# Patient Record
Sex: Male | Born: 1955 | Race: White | Hispanic: No | Marital: Married | Smoking: Former smoker
Health system: Southern US, Community
[De-identification: ages and names within clinical notes are randomized; demographics above are authoritative.]

## PROBLEM LIST (undated history)

## (undated) DIAGNOSIS — M1990 Unspecified osteoarthritis, unspecified site: Secondary | ICD-10-CM

## (undated) DIAGNOSIS — R51 Headache: Secondary | ICD-10-CM

## (undated) DIAGNOSIS — R42 Dizziness and giddiness: Secondary | ICD-10-CM

## (undated) DIAGNOSIS — J189 Pneumonia, unspecified organism: Secondary | ICD-10-CM

## (undated) DIAGNOSIS — R519 Headache, unspecified: Secondary | ICD-10-CM

## (undated) HISTORY — PX: TONSILLECTOMY: SUR1361

## (undated) HISTORY — PX: COLONOSCOPY: SHX174

---

## 2012-01-09 ENCOUNTER — Encounter: Payer: Self-pay | Admitting: *Deleted

## 2012-01-09 ENCOUNTER — Emergency Department
Admission: EM | Admit: 2012-01-09 | Discharge: 2012-01-09 | Disposition: A | Discharge status: Home / Self Care | Payer: Self-pay | Source: Home / Self Care | Attending: Emergency Medicine | Admitting: Emergency Medicine

## 2012-01-09 DIAGNOSIS — R05 Cough: Secondary | ICD-10-CM

## 2012-01-09 DIAGNOSIS — J069 Acute upper respiratory infection, unspecified: Secondary | ICD-10-CM

## 2012-01-09 DIAGNOSIS — R059 Cough, unspecified: Secondary | ICD-10-CM

## 2012-01-09 DIAGNOSIS — J209 Acute bronchitis, unspecified: Secondary | ICD-10-CM

## 2012-01-09 MED ORDER — AZITHROMYCIN 250 MG PO TABS: ORAL_TABLET | ORAL | Status: AC

## 2012-01-09 MED ORDER — BENZONATATE 200 MG PO CAPS: 200.0000 mg | ORAL_CAPSULE | Freq: Three times a day (TID) | ORAL | Status: AC | PRN

## 2012-01-09 MED ORDER — PREDNISONE (PAK) 10 MG PO TABS: 10.0000 mg | ORAL_TABLET | Freq: Every day | ORAL | Status: AC

## 2012-01-09 NOTE — ED Notes (Signed)
Patient c/o cough, congestion, sweats, fatigue and sinus drainage intermittently x 5 weeks. Taken day/nyquil, mucinex, and vitamin C.

## 2012-01-09 NOTE — ED Provider Notes (Signed)
History     CSN: [id number]  Arrival date & time 01/09/12  1337   First MD Initiated Contact with Patient 01/09/12 1346      Chief Complaint  Patient presents with  . Cough    (Consider location/radiation/quality/duration/timing/severity/associated sxs/prior treatment) HPI Allen Henry is a 56 y.o. male who complains of onset of cold symptoms for a month.  The symptoms are constant and mild-moderate in severity.  He states that the symptoms are intermittent and he gets better but then gets worse again.  As of today he is feeling a little bit better.  He is taking Mucinex, DayQuil, NyQuil, and vitamin C.  He states that he's been coughing a lot and that is causing him to be tired.  He feels been having a lot of sinus congestion and drainage. No sore throat + cough No pleuritic pain + wheezing + nasal congestion + post-nasal drainage + sinus pain/pressure + chest congestion No itchy/red eyes No earache No hemoptysis No SOB + chills/sweats No fever No nausea No vomiting No abdominal pain No diarrhea No skin rashes + fatigue No myalgias No headache    History reviewed. No pertinent past medical history.  Past Surgical History  Procedure Date  . Tonsillectomy     Family History  Problem Relation Age of Onset  . Heart attack Father     History  Substance Use Topics  . Smoking status: Former Games developer  . Smokeless tobacco: Not on file  . Alcohol Use: No      Review of Systems  All other systems reviewed and are negative.    Allergies  Penicillins  Home Medications   Current Outpatient Rx  Name Route Sig Dispense Refill  . AZITHROMYCIN 250 MG PO TABS  Use as directed 1 each 0  . BENZONATATE 200 MG PO CAPS Oral Take 1 capsule (200 mg total) by mouth 3 (three) times daily as needed for cough. 20 capsule 0  . PREDNISONE (PAK) 10 MG PO TABS Oral Take 1 tablet (10 mg total) by mouth daily. 6 day pack, use as directed, Disp 1 pack 21 tablet 0    BP 146/92   Pulse 106  Temp(Src) 98.1 F (36.7 C) (Oral)  Resp 18  Ht 5\' 10"  (1.778 m)  Wt 301 lb (136.533 kg)  BMI 43.19 kg/m2  SpO2 95%  Physical Exam  Nursing note and vitals reviewed. Constitutional: He is oriented to person, place, and time. He appears well-developed and well-nourished.  Non-toxic appearance. No distress.  HENT:  Head: Normocephalic and atraumatic.  Right Ear: Tympanic membrane, external ear and ear canal normal.  Left Ear: Tympanic membrane, external ear and ear canal normal.  Nose: Mucosal edema and rhinorrhea present.  Mouth/Throat: Abnormal dentition. Posterior oropharyngeal erythema present. No oropharyngeal exudate or posterior oropharyngeal edema.  Eyes: No scleral icterus.  Neck: Neck supple.  Cardiovascular: Regular rhythm and normal heart sounds.   Pulmonary/Chest: Effort normal and breath sounds normal. No respiratory distress. He has no decreased breath sounds. He has no wheezes. He has no rhonchi.  Neurological: He is alert and oriented to person, place, and time.  Skin: Skin is warm and dry.  Psychiatric: He has a normal mood and affect. His speech is normal.    ED Course  Procedures (including critical care time)  Labs Reviewed - No data to display No results found.   1. Acute bronchitis   2. Cough   3. Acute upper respiratory infections of unspecified site  MDM  1)  Take the prescribed antibiotic as instructed.  I feel that he most likely has bronchitis, possibly from an allergic rhinitis.  However we are gonna start him today on a Z-Pak to cover for any current infection, Tessalon for the cough and we gave him some samples, and prednisone to help with his wheezing and shortness of breath.  I let him know that we will hold off on the x-ray for today since he feels a bit better and I don't hear any evidence of pneumonia and because we are treating him anyway with antibiotics, however if within the next couple days he started feeling worse again  despite the treatment then I would suggest getting a chest x-ray.  He would agree to this especially since he does not have insurance and would like to keep his bill low. 2)  Use nasal saline solution (over the counter) at least 3 times a day. 3)  Use over the counter decongestants like Zyrtec-D every 12 hours as needed to help with congestion.  If you have hypertension, do not take medicines with sudafed.  4)  Can take tylenol every 6 hours or motrin every 8 hours for pain or fever. 5)  Follow up with your primary doctor if no improvement in 5-7 days, sooner if increasing pain, fever, or new symptoms.        Marlaine Hind, MD 01/09/12 1415

## 2014-04-14 ENCOUNTER — Encounter: Payer: Self-pay | Admitting: Emergency Medicine

## 2014-04-14 ENCOUNTER — Emergency Department
Admission: EM | Admit: 2014-04-14 | Discharge: 2014-04-14 | Disposition: A | Discharge status: Home / Self Care | Payer: Self-pay | Source: Home / Self Care | Attending: Emergency Medicine | Admitting: Emergency Medicine

## 2014-04-14 DIAGNOSIS — J069 Acute upper respiratory infection, unspecified: Secondary | ICD-10-CM

## 2014-04-14 HISTORY — DX: Dizziness and giddiness: R42

## 2014-04-14 MED ORDER — AMOXICILLIN 875 MG PO TABS: 875.0000 mg | ORAL_TABLET | Freq: Two times a day (BID) | ORAL | Status: DC

## 2014-04-14 MED ORDER — PREDNISONE 10 MG PO TABS: 20.0000 mg | ORAL_TABLET | Freq: Two times a day (BID) | ORAL | Status: DC

## 2014-04-14 NOTE — ED Provider Notes (Signed)
CSN: [id number]     Arrival date & time 04/14/14  [id number] History   First MD Initiated Contact with Patient 04/14/14 [phone]     Chief Complaint  Patient presents with  . Cough  . Otalgia   (Consider location/radiation/quality/duration/timing/severity/associated sxs/prior Treatment) HPI Allen Henry is a 58 y.o. male who complains of onset of cold symptoms for 4 days.  The symptoms are constant and mild-moderate in severity.  Taking ASA which helps and OTC cough & cold meds which help short-term. No sore throat +/- cough No pleuritic pain No wheezing + nasal congestion + post-nasal drainage No sinus pain/pressure No chest congestion No itchy/red eyes ++ L>R earache No hemoptysis No SOB No chills/sweats No fever No nausea No vomiting No abdominal pain No diarrhea No skin rashes No fatigue No myalgias + headache     Past Medical History  Diagnosis Date  . Vertigo    Past Surgical History  Procedure Laterality Date  . Tonsillectomy     Family History  Problem Relation Age of Onset  . Heart attack Father   . Cancer Father   . Cancer Mother    History  Substance Use Topics  . Smoking status: Former Games developermoker  . Smokeless tobacco: Not on file  . Alcohol Use: No    Review of Systems  All other systems reviewed and are negative.   Allergies  Penicillins  Home Medications   Prior to Admission medications   Medication Sig Start Date End Date Taking? Authorizing Provider  amoxicillin (AMOXIL) 875 MG tablet Take 1 tablet (875 mg total) by mouth 2 (two) times daily. 04/14/14   Marlaine HindJeffrey H Broc Caspers, MD  predniSONE (DELTASONE) 10 MG tablet Take 2 tablets (20 mg total) by mouth 2 (two) times daily with a meal. 04/14/14   Marlaine HindJeffrey H Laiyah Exline, MD   BP 161/90  Pulse 102  Temp(Src) 98.1 F (36.7 C) (Oral)  Resp 18  Ht 5\' 11"  (1.803 m)  Wt 303 lb (137.44 kg)  BMI 42.28 kg/m2  SpO2 96% Physical Exam  Nursing note and vitals reviewed. Constitutional: He is oriented to person,  place, and time. He appears well-developed and well-nourished.  HENT:  Head: Normocephalic and atraumatic.  Right Ear: External ear and ear canal normal. Tympanic membrane is erythematous. A middle ear effusion is present.  Left Ear: External ear and ear canal normal. Tympanic membrane is erythematous and bulging. A middle ear effusion is present.  Nose: Mucosal edema and rhinorrhea present.  Mouth/Throat: No oropharyngeal exudate, posterior oropharyngeal edema or posterior oropharyngeal erythema.  Eyes: No scleral icterus.  Neck: Neck supple.  Cardiovascular: Regular rhythm and normal heart sounds.   Pulmonary/Chest: Effort normal and breath sounds normal. No respiratory distress. He has no decreased breath sounds. He has no wheezes. He has no rhonchi.  Neurological: He is alert and oriented to person, place, and time.  Skin: Skin is warm and dry.  Psychiatric: He has a normal mood and affect. His speech is normal.    ED Course  Procedures (including critical care time) Labs Review Labs Reviewed - No data to display  Imaging Review No results found.   MDM   1. Acute upper respiratory infections of unspecified site    1)  Take the prescribed antibiotic as instructed.  Amox was prescribed.  Patient has remote history of penicillin allergy as very small child but has taken penicillin and amoxicillin since then with no problems, so I do not believe that he has a true penicillin allergy.  With a double ear infection, I think this would be the best antibiotic for this but gave him warning that if he had any reaction to stop and call.  Also gave him a prescription for prednisone that he's not getting better over the next few days if he can take that as well for symptomatic management. 2)  Use nasal saline solution (over the counter) at least 3 times a day. 3)  Use over the counter decongestants like Zyrtec-D every 12 hours as needed to help with congestion.  If you have hypertension, do not  take medicines with sudafed.  4)  Can take tylenol every 6 hours or motrin every 8 hours for pain or fever. 5)  Follow up with your primary doctor if no improvement in 5-7 days, sooner if increasing pain, fever, or new symptoms.     Marlaine HindJeffrey H Inaara Tye, MD 04/14/14 [phone]

## 2014-04-14 NOTE — ED Notes (Signed)
Pt c/o cough, chest congestion and LT ear pain x 4 days. Denies fever.

## 2014-04-17 ENCOUNTER — Telehealth: Payer: Self-pay | Admitting: Emergency Medicine

## 2014-04-22 ENCOUNTER — Telehealth: Payer: Self-pay | Admitting: Emergency Medicine

## 2017-01-23 ENCOUNTER — Ambulatory Visit (INDEPENDENT_AMBULATORY_CARE_PROVIDER_SITE_OTHER): Payer: Self-pay | Admitting: Specialist

## 2017-01-30 ENCOUNTER — Ambulatory Visit (INDEPENDENT_AMBULATORY_CARE_PROVIDER_SITE_OTHER): Payer: BLUE CROSS/BLUE SHIELD | Admitting: Specialist

## 2017-01-30 ENCOUNTER — Encounter (INDEPENDENT_AMBULATORY_CARE_PROVIDER_SITE_OTHER): Payer: Self-pay | Admitting: Specialist

## 2017-01-30 ENCOUNTER — Ambulatory Visit (INDEPENDENT_AMBULATORY_CARE_PROVIDER_SITE_OTHER): Payer: BLUE CROSS/BLUE SHIELD

## 2017-01-30 VITALS — BP 128/81 | HR 82 | Ht 71.0 in | Wt 315.0 lb

## 2017-01-30 DIAGNOSIS — M48062 Spinal stenosis, lumbar region with neurogenic claudication: Secondary | ICD-10-CM | POA: Diagnosis not present

## 2017-01-30 DIAGNOSIS — M47816 Spondylosis without myelopathy or radiculopathy, lumbar region: Secondary | ICD-10-CM

## 2017-01-30 DIAGNOSIS — M545 Low back pain: Secondary | ICD-10-CM

## 2017-01-30 MED ORDER — DICLOFENAC SODIUM 75 MG PO TBEC: 75.0000 mg | DELAYED_RELEASE_TABLET | Freq: Two times a day (BID) | ORAL | 4 refills | Status: DC

## 2017-01-30 NOTE — Patient Instructions (Signed)
Avoid bending, stooping and avoid lifting weights greater than 10 lbs. Avoid prolong standing and walking. Avoid frequent bending and stooping  No lifting greater than 10 lbs. May use ice or moist heat for pain. Weight loss is of benefit. Handicap license is approved.  

## 2017-01-30 NOTE — Progress Notes (Addendum)
Office Visit Note   Patient: Allen Henry           Date of Birth: 1956/03/13           MRN: [id number] Visit Date: 01/30/2017              Requested by: No referring provider defined for this encounter. PCP: No PCP Per Patient   Assessment & Plan: Visit Diagnoses:  1. Low back pain, unspecified back pain laterality, unspecified chronicity, with sciatica presence unspecified   2. Spondylosis without myelopathy or radiculopathy, lumbar region   3. 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 Noticed increasing discomfort into his back and buttocks and right posterior thigh and calf greater than left. Seen by  Dr Lorenso Courier in  [place] and underwent 2 facet injections. His studies,  MRI 04/2015 shows facet arthrosis nearly every segment of the lumbar spine with mild spurs anterior lumbar discs and moderate subarticular lateral recess stenosis L4-5 and moderate foramenal stenosis right L5-S1. Likely there are stenosis symptoms and spondylosis of the lumbar spine. I think he functions at a high level and can walk a mile though with some difficulty. He is borderline for considering any surgery. Recommend activity modification, primary flexion exercises which can be added to exercise therapy he is already doing in [facility]. Continue NSAIDs And consider gabapentin, weight loss, explained the rationale for conservative management and the pathophysiology of stenosis of the lumbar spine. Return in 6-8 weeks to assess his response to PT.   Plan: Avoid bending, stooping and avoid lifting weights greater than 10 lbs. Avoid prolong standing and walking. Avoid frequent bending and stooping  No lifting greater than 10 lbs. May use ice or moist heat for pain. Weight loss is of benefit. Handicap license is  approved.    Follow-Up Instructions: No Follow-up on file.   Orders:  Orders Placed This Encounter  Procedures  . XR Lumbar Spine 2-3 Views   Meds ordered this encounter  Medications  . diclofenac (VOLTAREN) 75 MG EC tablet    Sig: Take 1 tablet (75 mg total) by mouth 2 (two) times daily.    Dispense:  180 tablet    Refill:  4      Procedures: No procedures performed   Clinical Data: Findings:  Admit Date:04/27/2015 10:51  ###FINAL RESULT###    INDICATIONS: LOW BACK PAIN RADIATING TO RIGHT SIDE WITH POSTERIOR RIGHT  LEG PAIN TO FOOT, PAI COMMENTS:   PROCEDURE:QMR 1130- MRI L-SPINE WO CONT - Apr 27 2015  [employer] Accession #: [id number] [facility] Accession #: [id number]     MRI lumbar spine:  INDICATION: Low back pain radiating to the right leg and foot.  TECHNIQUE: Sagittal and axial T1 and T2-weighted sequences were  performed. Additional sagittal STIR images were performed.  COMPARISON: None available  FINDINGS: #Vertebral bodies: No compression fracture. #Alignment: Normal. #Marrow signal: No significant abnormality. #Conus medullaris: Normal. Terminates at L1 with no evidence of  tethering. #Lower thoracic segments: No significant abnormality. #   #L1-2: Moderate facet joint arthritis. #L2-3: Moderate facet joint arthritis. #L3-4: Moderate facet joint arthritis. #L4-5: Moderately severe facet joint arthritis with some thickening of  the ligamentum flavum. Moderate spinal stenosis. Moderate bilateral  foraminal stenosis. #L5-S1: Severe facet joint arthritis, worse on the right. Moderate  foraminal stenosis on the right.  IMPRESSION: Severe facet joint arthritis lower lumbar spine resulting in moderate  spinal stenosis at L4-5 and moderate foraminal stenoses bilaterally at  L4-5 and on the right at L5-S1.     Subjective: Chief Complaint  Patient presents with  . Lower Back - Pain, Injury    Feel  out of tub backward and hit toilet with back     61 year old male with history of low back pain greater than the neck and shoulder blades and into the legs. Neck with full ROM, aching occasionally but the neck feels okay. Has undergone facet injections in August 2016, and sept 27, 2016 with steriod and marcaine. Dr. Lorenso Courier, [facility], fax [phone]. Records [phone]. He missed follow up in December 2016. No rehab set up, told that he had bad  Arthritis but not disabiltiy. Pain is an 7-8 when it is worst, moving a wrong way. Bending hurts, stooping Reaching for shoes with pain. Walking is sore but able to walk, at work now. Sometimes has to sit to improve his standing tolerance. Occasionally with hand numbness right greater than left. He is right hand dominant. Fell on ice hitting his elbow and has noticed some numbness in the right hand since then. In to the right little finger side. He has a RCT tear right shoulder being treated with therapy. Goes to [facility] in [place]. Sleeping most okay but when his back is out it is difficult. Left leg flexion helps to decrease pain. When pain is worst hard to reach feet or perform hygiene post toilet, turning increases pain. Pain is episodic occasionally one a day sometimes couple of days to a week without pain. The right leg with numbness in the heel and into the toes all right foot. Working last one month, more active, noted less frequent episodes. 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     Review of Systems  Constitutional: Negative.   HENT: Negative.   Eyes: Negative.   Respiratory: Negative.   Cardiovascular: Negative.   Gastrointestinal: Negative.   Endocrine: Negative.   Genitourinary: Negative.   Musculoskeletal: Negative.   Skin: Negative.   Allergic/Immunologic: Negative.   Neurological: Negative.   Hematological: Negative.    Psychiatric/Behavioral: Negative.      Objective: Vital Signs: BP 128/81 (BP Location: Left Arm, Patient Position: Sitting)   Pulse 82   Ht  (1.803 m)   Wt (!) 315 lb (142.9 kg)   BMI 43.93 kg/m   Physical Exam  Constitutional: He is oriented to person, place, and time. He appears well-developed and well-nourished.  HENT:  Head: Normocephalic and atraumatic.  Eyes: EOM are normal. Pupils are equal, round, and reactive to light.  Neck: Normal range of motion. Neck supple.  Pulmonary/Chest: Effort normal and breath sounds normal.  Abdominal: Soft. Bowel sounds are normal.  Neurological: He is alert and oriented to person, place, and time.  Skin: Skin is warm and dry.  Psychiatric: He has a normal mood and affect. His behavior is normal. Judgment and thought content normal.    Back Exam   Tenderness  The patient is experiencing tenderness in the lumbar.  Range of Motion  Extension: abnormal  Flexion:  80 normal  Lateral Bend Right: normal  Lateral Bend Left: normal  Rotation Right: normal  Rotation Left: normal   Muscle Strength  Right Quadriceps:  5/5  Left Quadriceps:  5/5  Right Hamstrings:  5/5  Left Hamstrings:  5/5   Tests  Straight leg raise right: negative Straight leg raise left: negative  Reflexes  Patellar: normal Achilles: normal Babinski's sign: normal   Other  Toe Walk: normal Heel Walk: normal Sensation: normal Gait: normal  Erythema: no back redness Scars: absent      Specialty Comments:  No specialty comments available.  Imaging: No results found.   PMFS History: There are no active problems to display for this patient.  Past Medical History:  Diagnosis Date  . Vertigo     Family History  Problem Relation Age of Onset  . Heart attack Father   . Cancer Father   . Cancer Mother     Past Surgical History:  Procedure Laterality Date  . TONSILLECTOMY     Social History   Occupational History  . Not on file.    Social History Main Topics  . Smoking status: Former Games developer  . Smokeless tobacco: Never Used  . Alcohol use No  . Drug use: No  . Sexual activity: Not on file

## 2017-02-01 ENCOUNTER — Telehealth (INDEPENDENT_AMBULATORY_CARE_PROVIDER_SITE_OTHER): Payer: Self-pay | Admitting: Specialist

## 2017-02-01 NOTE — Telephone Encounter (Signed)
Pt wife asked if the Voltaren Tablet can be sent to [facility] on [address] In [place]. Pt didn't get a script.  [phone]

## 2017-02-01 NOTE — Telephone Encounter (Signed)
I called and advised pts wife Rinaldo Cloud that rx was called in on Wednesday to [facility] @ [employer]

## 2017-02-21 ENCOUNTER — Ambulatory Visit (INDEPENDENT_AMBULATORY_CARE_PROVIDER_SITE_OTHER): Payer: Self-pay | Admitting: Specialist

## 2017-03-06 ENCOUNTER — Ambulatory Visit (INDEPENDENT_AMBULATORY_CARE_PROVIDER_SITE_OTHER): Payer: Self-pay | Admitting: Specialist

## 2017-03-20 ENCOUNTER — Encounter (INDEPENDENT_AMBULATORY_CARE_PROVIDER_SITE_OTHER): Payer: Self-pay | Admitting: Specialist

## 2017-03-20 ENCOUNTER — Ambulatory Visit (INDEPENDENT_AMBULATORY_CARE_PROVIDER_SITE_OTHER): Payer: BLUE CROSS/BLUE SHIELD | Admitting: Specialist

## 2017-03-20 VITALS — BP 124/76 | HR 72 | Ht 71.0 in | Wt 300.0 lb

## 2017-03-20 DIAGNOSIS — M5416 Radiculopathy, lumbar region: Secondary | ICD-10-CM

## 2017-05-06 ENCOUNTER — Ambulatory Visit (INDEPENDENT_AMBULATORY_CARE_PROVIDER_SITE_OTHER): Payer: BLUE CROSS/BLUE SHIELD | Admitting: Specialist

## 2017-05-06 ENCOUNTER — Encounter (INDEPENDENT_AMBULATORY_CARE_PROVIDER_SITE_OTHER): Payer: Self-pay | Admitting: Specialist

## 2017-05-06 VITALS — BP 136/77 | HR 71 | Ht 70.5 in | Wt 291.0 lb

## 2017-05-06 DIAGNOSIS — M4726 Other spondylosis with radiculopathy, lumbar region: Secondary | ICD-10-CM | POA: Diagnosis not present

## 2017-05-06 DIAGNOSIS — M48062 Spinal stenosis, lumbar region with neurogenic claudication: Secondary | ICD-10-CM

## 2017-05-06 MED ORDER — OXAPROZIN 600 MG PO TABS: 600.0000 mg | ORAL_TABLET | Freq: Every day | ORAL | 3 refills | Status: DC

## 2017-05-06 MED ORDER — GABAPENTIN 300 MG PO CAPS: 300.0000 mg | ORAL_CAPSULE | Freq: Every day | ORAL | 3 refills | Status: DC

## 2017-05-06 NOTE — Progress Notes (Signed)
Office Visit Note   Patient: Allen NatalRaymond Henry           Date of Birth: 1955-12-25           MRN: [id number] Visit Date: 05/06/2017              Requested by: No referring provider defined for this encounter. PCP: Marvis RepressSchaeffer, Stanley, MD   Assessment & Plan: Visit Diagnoses:  1. Spinal stenosis of lumbar region with neurogenic claudication   2. Other spondylosis with radiculopathy, lumbar region     Plan: Avoid bending, stooping and avoid lifting weights greater than 10 lbs. Avoid prolong standing and walking. Avoid frequent bending and stooping  No lifting greater than 10 lbs. May use ice or moist heat for pain. Weight loss is of benefit. Handicap license is approved.   Follow-Up Instructions: No Follow-up on file.   Orders:  No orders of the defined types were placed in this encounter.  No orders of the defined types were placed in this encounter.     Procedures: No procedures performed   Clinical Data: No additional findings.   Subjective: Chief Complaint  Patient presents with  . 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 Pain is about 2/3rd s of the way down the back. Pain worsens sometimes with standing and walking. Bowel and bladder is okay. He notices pain with sitting in church with a twist, or turning when in bed. Yesterday he Had difficulty even walking a short distance. The parking lot has been redone and now the trucks are parked farther away and their parking is a problem.      Review of Systems  Constitutional: Negative.   HENT: Positive for rhinorrhea. Negative for ear discharge, ear pain, facial swelling, hearing loss, sore throat and tinnitus.   Eyes: Negative.  Negative for photophobia, pain, discharge, redness, itching and visual disturbance.  Respiratory: Negative.  Negative  for choking, chest tightness, shortness of breath and wheezing.   Cardiovascular: Negative.  Negative for chest pain.  Gastrointestinal: Negative.  Negative for abdominal distention, abdominal pain, anal bleeding, blood in stool, constipation, nausea and rectal pain.  Endocrine: Negative.   Genitourinary: Negative.  Negative for dysuria, enuresis and flank pain.  Musculoskeletal: Positive for back pain and gait problem. Negative for neck pain and neck stiffness.  Skin: Negative.  Negative for color change, pallor and rash.  Allergic/Immunologic: Negative.   Neurological: Positive for dizziness and syncope. Negative for weakness and numbness.  Hematological: Negative.  Negative for adenopathy. Does not bruise/bleed easily.  Psychiatric/Behavioral: Negative.  Negative for agitation, behavioral problems, confusion, decreased concentration, dysphoric mood, hallucinations, self-injury, sleep disturbance and suicidal ideas. The patient is not nervous/anxious and is not hyperactive.      Objective: Vital Signs: BP 136/77   Pulse 71   Ht 5' 10.5" (1.791 m)   Wt 291 lb (132 kg)   BMI 41.16 kg/m   Physical Exam  Constitutional: He is oriented to person, place, and time. He appears well-developed and well-nourished.  HENT:  Head: Normocephalic and atraumatic.  Eyes: Pupils are equal, round, and reactive to light. EOM are normal.  Neck: Normal range of motion. Neck supple.  Pulmonary/Chest: Effort normal and breath sounds normal.  Abdominal: Soft. Bowel sounds are normal.  Musculoskeletal: Normal range of motion.  Neurological: He is alert and oriented to person, place, and time.  Skin: Skin is warm and dry.  Psychiatric: He has a normal mood and affect. His behavior is normal. Judgment and thought content normal.    Ortho Exam  Specialty Comments:  No specialty comments available.  Imaging: No results found.   PMFS History: There are no active problems to display for this  patient.  Past Medical History:  Diagnosis Date  . Vertigo     Family History  Problem Relation Age of Onset  . Heart attack Father   . Cancer Father   . Cancer Mother     Past Surgical History:  Procedure Laterality Date  . TONSILLECTOMY     Social History   Occupational History  . Not on file.   Social History Main Topics  . Smoking status: Former Games developermoker  . Smokeless tobacco: Never Used  . Alcohol use No  . Drug use: No  . Sexual activity: Not on file

## 2017-05-06 NOTE — Patient Instructions (Signed)
Plan: Avoid bending, stooping and avoid lifting weights greater than 10 lbs. Avoid prolong standing and walking. Avoid frequent bending and stooping  No lifting greater than 10 lbs. May use ice or moist heat for pain. Weight loss is of benefit. Handicap license is approved. 

## 2017-06-06 ENCOUNTER — Other Ambulatory Visit (INDEPENDENT_AMBULATORY_CARE_PROVIDER_SITE_OTHER): Payer: Self-pay | Admitting: Specialist

## 2017-06-12 ENCOUNTER — Other Ambulatory Visit (HOSPITAL_COMMUNITY): Payer: Self-pay | Admitting: *Deleted

## 2017-06-12 ENCOUNTER — Encounter (HOSPITAL_COMMUNITY): Payer: Self-pay

## 2017-06-12 ENCOUNTER — Encounter (HOSPITAL_COMMUNITY)
Admission: RE | Admit: 2017-06-12 | Discharge: 2017-06-12 | Disposition: A | Discharge status: Home / Self Care | Payer: BLUE CROSS/BLUE SHIELD | Source: Ambulatory Visit | Attending: Specialist | Admitting: Specialist

## 2017-06-12 DIAGNOSIS — Z01812 Encounter for preprocedural laboratory examination: Secondary | ICD-10-CM | POA: Insufficient documentation

## 2017-06-12 DIAGNOSIS — R42 Dizziness and giddiness: Secondary | ICD-10-CM | POA: Diagnosis not present

## 2017-06-12 DIAGNOSIS — Z8701 Personal history of pneumonia (recurrent): Secondary | ICD-10-CM | POA: Diagnosis not present

## 2017-06-12 DIAGNOSIS — M1990 Unspecified osteoarthritis, unspecified site: Secondary | ICD-10-CM | POA: Diagnosis not present

## 2017-06-12 DIAGNOSIS — R51 Headache: Secondary | ICD-10-CM | POA: Diagnosis not present

## 2017-06-12 HISTORY — DX: Headache: R51

## 2017-06-12 HISTORY — DX: Headache, unspecified: R51.9

## 2017-06-12 HISTORY — DX: Unspecified osteoarthritis, unspecified site: M19.90

## 2017-06-12 HISTORY — DX: Pneumonia, unspecified organism: J18.9

## 2017-06-12 LAB — URINALYSIS, ROUTINE W REFLEX MICROSCOPIC
BILIRUBIN URINE: NEGATIVE
GLUCOSE, UA: NEGATIVE mg/dL
HGB URINE DIPSTICK: NEGATIVE
KETONES UR: NEGATIVE mg/dL
Leukocytes, UA: NEGATIVE
Nitrite: NEGATIVE
PH: 6 (ref 5.0–8.0)
Protein, ur: NEGATIVE mg/dL
SPECIFIC GRAVITY, URINE: 1.018 (ref 1.005–1.030)

## 2017-06-12 LAB — COMPREHENSIVE METABOLIC PANEL
ALK PHOS: 60 U/L (ref 38–126)
ALT: 18 U/L (ref 17–63)
AST: 24 U/L (ref 15–41)
Albumin: 3.7 g/dL (ref 3.5–5.0)
Anion gap: 9 (ref 5–15)
BILIRUBIN TOTAL: 1.5 mg/dL — AB (ref 0.3–1.2)
BUN: 11 mg/dL (ref 6–20)
CALCIUM: 9.1 mg/dL (ref 8.9–10.3)
CO2: 23 mmol/L (ref 22–32)
CREATININE: 0.93 mg/dL (ref 0.61–1.24)
Chloride: 108 mmol/L (ref 101–111)
GFR calc Af Amer: >60 mL/min (ref >60)
Glucose, Bld: 90 mg/dL (ref 65–99)
POTASSIUM: 4.4 mmol/L (ref 3.5–5.1)
Sodium: 140 mmol/L (ref 135–145)
TOTAL PROTEIN: 7 g/dL (ref 6.5–8.1)

## 2017-06-12 LAB — CBC
HEMATOCRIT: 49.1 % (ref 39.0–52.0)
Hemoglobin: 16.3 g/dL (ref 13.0–17.0)
MCH: 30.8 pg (ref 26.0–34.0)
MCHC: 33.2 g/dL (ref 30.0–36.0)
MCV: 92.8 fL (ref 78.0–100.0)
Platelets: 237 10*3/uL (ref 150–400)
RBC: 5.29 MIL/uL (ref 4.22–5.81)
RDW: 13.9 % (ref 11.5–15.5)
WBC: 9.7 10*3/uL (ref 4.0–10.5)

## 2017-06-12 LAB — PROTIME-INR
INR: 1.04
PROTHROMBIN TIME: 13.5 s (ref 11.4–15.2)

## 2017-06-12 LAB — APTT: aPTT: 43 seconds — ABNORMAL HIGH (ref 24–36)

## 2017-06-12 LAB — SURGICAL PCR SCREEN
MRSA, PCR: NEGATIVE
STAPHYLOCOCCUS AUREUS: NEGATIVE

## 2017-06-12 NOTE — Progress Notes (Signed)
Pt denies cardiac history, chest pain or sob. Pt states he is not diabetic. 

## 2017-06-12 NOTE — Pre-Procedure Instructions (Signed)
Allen BrasilRaymond Kenneth Odem Jr.  06/12/2017    Your procedure is scheduled on Friday, June 14, 2017 at 12:30 PM.   [facility] "A" Admitting Office at 10:30 AM.   Call this number if you have problems the morning of surgery: [phone]   Questions prior to day of surgery, please call [phone] between 8 & 4 PM.   Remember:  Do not eat food or drink liquids after midnight Thursday, 06/13/17.  Stop NSAIDS (Daypro, Aleve, Ibuprofen, etc) and  Multivitamins as of today. Do not use Aspirin products prior to surgery.    Do not wear jewelry.  Do not wear lotions, powders, cologne or deodorant.  Men may shave face and neck.  Do not bring valuables to the hospital.  [facility] is not responsible for any belongings or valuables.  Contacts, dentures or bridgework may not be worn into surgery.  Leave your suitcase in the car.  After surgery it may be brought to your room.  For patients admitted to the hospital, discharge time will be determined by your treatment team.  [facility] - Pre[facility]  Before surgery, you can play an important role.  Because skin is not sterile, your skin needs to be as free of germs as possible.  You can reduce the number of germs on you skin by washing with CHG (chlorahexidine gluconate) soap before surgery.  CHG is an antiseptic cleaner which kills germs and bonds with the skin to continue killing germs even after washing.  Please DO NOT use if you have an allergy to CHG or antibacterial soaps.  If your skin becomes reddened/irritated stop using the CHG and inform your nurse when you arrive at Short Stay.  Do not shave (including legs and underarms) for at least 48 hours prior to the first CHG shower.  You may shave your face.  Please follow these instructions carefully:   1.  Shower with CHG Soap the night before surgery and the                    morning of Surgery.  2.  If you choose to wash your hair, wash your hair  first as usual with your       normal shampoo.  3.  After you shampoo, rinse your hair and body thoroughly to remove the shampoo.  4.  Use CHG as you would any other liquid soap.  You can apply chg directly       to the skin and wash gently with scrungie or a clean washcloth.  5.  Apply the CHG Soap to your body ONLY FROM THE NECK DOWN.        Do not use on open wounds or open sores.  Avoid contact with your eyes, ears, mouth and genitals (private parts).  Wash genitals (private parts) with your normal soap.  6.  Wash thoroughly, paying special attention to the area where your surgery        will be performed.  7.  Thoroughly rinse your body with warm water from the neck down.  8.  DO NOT shower/wash with your normal soap after using and rinsing off       the CHG Soap.  9.  Pat yourself dry with a clean towel.            10.  Wear clean pajamas.            11.  Place clean sheets  on your bed the night of your first shower and do not        sleep with pets.  Day of Surgery  Do not apply any lotions/deodorants the morning of surgery.  Please wear clean clothes to the hospital.   Please read over the fact sheets that you were given.

## 2017-06-13 ENCOUNTER — Encounter (HOSPITAL_COMMUNITY): Payer: Self-pay | Admitting: Emergency Medicine

## 2017-06-13 ENCOUNTER — Encounter (HOSPITAL_COMMUNITY): Payer: Self-pay | Admitting: Certified Registered"

## 2017-06-13 MED ORDER — SODIUM CHLORIDE 0.9 % IV SOLN
1500.0000 mg | INTRAVENOUS | Status: DC
  Filled 2017-06-13: qty 1500

## 2017-06-14 ENCOUNTER — Ambulatory Visit (HOSPITAL_COMMUNITY): Admission: RE | Admit: 2017-06-14 | Payer: BLUE CROSS/BLUE SHIELD | Source: Ambulatory Visit | Admitting: Specialist

## 2017-06-14 ENCOUNTER — Encounter (HOSPITAL_COMMUNITY): Admission: RE | Payer: Self-pay | Source: Ambulatory Visit

## 2017-06-14 SURGERY — MICRODISCECTOMY LUMBAR LAMINECTOMY
Anesthesia: General

## 2017-06-19 ENCOUNTER — Ambulatory Visit (HOSPITAL_COMMUNITY): Payer: BLUE CROSS/BLUE SHIELD

## 2017-06-19 ENCOUNTER — Inpatient Hospital Stay (HOSPITAL_COMMUNITY)
Admission: AD | Admit: 2017-06-19 | Discharge: 2017-06-21 | DRG: 517 | Disposition: A | Discharge status: Home / Self Care | Payer: BLUE CROSS/BLUE SHIELD | Source: Ambulatory Visit | Attending: Specialist | Admitting: Specialist

## 2017-06-19 ENCOUNTER — Encounter (HOSPITAL_COMMUNITY): Payer: Self-pay

## 2017-06-19 ENCOUNTER — Ambulatory Visit (HOSPITAL_COMMUNITY): Payer: BLUE CROSS/BLUE SHIELD | Admitting: Certified Registered"

## 2017-06-19 ENCOUNTER — Inpatient Hospital Stay (HOSPITAL_COMMUNITY)
Admission: AD | Disposition: A | Discharge status: Home / Self Care | Payer: Self-pay | Source: Ambulatory Visit | Attending: Specialist

## 2017-06-19 DIAGNOSIS — M48061 Spinal stenosis, lumbar region without neurogenic claudication: Secondary | ICD-10-CM | POA: Diagnosis present

## 2017-06-19 DIAGNOSIS — Z87891 Personal history of nicotine dependence: Secondary | ICD-10-CM

## 2017-06-19 DIAGNOSIS — Z79899 Other long term (current) drug therapy: Secondary | ICD-10-CM

## 2017-06-19 DIAGNOSIS — Z419 Encounter for procedure for purposes other than remedying health state, unspecified: Secondary | ICD-10-CM

## 2017-06-19 DIAGNOSIS — Z9889 Other specified postprocedural states: Secondary | ICD-10-CM

## 2017-06-19 DIAGNOSIS — M4807 Spinal stenosis, lumbosacral region: Secondary | ICD-10-CM | POA: Diagnosis present

## 2017-06-19 DIAGNOSIS — M48062 Spinal stenosis, lumbar region with neurogenic claudication: Secondary | ICD-10-CM

## 2017-06-19 HISTORY — PX: LUMBAR LAMINECTOMY: SHX95

## 2017-06-19 SURGERY — MICRODISCECTOMY LUMBAR LAMINECTOMY
Anesthesia: General | Site: Spine Lumbar

## 2017-06-19 MED ORDER — GELATIN ABSORBABLE MT POWD
OROMUCOSAL | Status: DC | PRN
  Administered 2017-06-19: 5 mL via TOPICAL

## 2017-06-19 MED ORDER — PHENOL 1.4 % MT LIQD: 1.0000 | OROMUCOSAL | Status: DC | PRN

## 2017-06-19 MED ORDER — SUCCINYLCHOLINE CHLORIDE 200 MG/10ML IV SOSY
PREFILLED_SYRINGE | INTRAVENOUS | Status: AC
  Filled 2017-06-19: qty 10

## 2017-06-19 MED ORDER — PROPOFOL 10 MG/ML IV BOLUS
INTRAVENOUS | Status: DC | PRN
  Administered 2017-06-19: 200 mg via INTRAVENOUS

## 2017-06-19 MED ORDER — ALBUTEROL SULFATE HFA 108 (90 BASE) MCG/ACT IN AERS
INHALATION_SPRAY | RESPIRATORY_TRACT | Status: AC
  Filled 2017-06-19: qty 6.7

## 2017-06-19 MED ORDER — THROMBIN 20000 UNITS EX SOLR
CUTANEOUS | Status: AC
  Filled 2017-06-19: qty 20000

## 2017-06-19 MED ORDER — HYDROMORPHONE HCL 1 MG/ML IJ SOLN
INTRAMUSCULAR | Status: AC
  Administered 2017-06-19: 0.5 mg via INTRAVENOUS
  Filled 2017-06-19: qty 1

## 2017-06-19 MED ORDER — ADULT MULTIVITAMIN W/MINERALS CH
1.0000 | ORAL_TABLET | Freq: Every day | ORAL | Status: DC
  Administered 2017-06-20 – 2017-06-21 (×2): 1 via ORAL
  Filled 2017-06-19 (×2): qty 1

## 2017-06-19 MED ORDER — HYDROMORPHONE HCL 1 MG/ML IJ SOLN
0.2500 mg | INTRAMUSCULAR | Status: DC | PRN
  Administered 2017-06-19 (×2): 0.5 mg via INTRAVENOUS

## 2017-06-19 MED ORDER — ALBUMIN HUMAN 5 % IV SOLN
INTRAVENOUS | Status: DC | PRN
  Administered 2017-06-19: 17:00:00 via INTRAVENOUS

## 2017-06-19 MED ORDER — ONDANSETRON HCL 4 MG/2ML IJ SOLN: 4.0000 mg | Freq: Four times a day (QID) | INTRAMUSCULAR | Status: DC | PRN

## 2017-06-19 MED ORDER — LIDOCAINE HCL (CARDIAC) 20 MG/ML IV SOLN
INTRAVENOUS | Status: DC | PRN
  Administered 2017-06-19: 100 mg via INTRAVENOUS

## 2017-06-19 MED ORDER — OXYCODONE HCL 5 MG PO TABS
5.0000 mg | ORAL_TABLET | ORAL | Status: DC | PRN
  Administered 2017-06-20: 10 mg via ORAL
  Filled 2017-06-19: qty 2

## 2017-06-19 MED ORDER — SUGAMMADEX SODIUM 200 MG/2ML IV SOLN
INTRAVENOUS | Status: DC | PRN
Start: 2017-06-19 — End: 2017-06-19
  Administered 2017-06-19: 200 mg via INTRAVENOUS

## 2017-06-19 MED ORDER — BUPIVACAINE HCL 0.5 % IJ SOLN: INTRAMUSCULAR | Status: DC | PRN

## 2017-06-19 MED ORDER — MENTHOL 3 MG MT LOZG: 1.0000 | LOZENGE | OROMUCOSAL | Status: DC | PRN

## 2017-06-19 MED ORDER — FENTANYL CITRATE (PF) 250 MCG/5ML IJ SOLN
INTRAMUSCULAR | Status: AC
  Filled 2017-06-19: qty 5

## 2017-06-19 MED ORDER — ROCURONIUM BROMIDE 100 MG/10ML IV SOLN
INTRAVENOUS | Status: DC | PRN
  Administered 2017-06-19: 20 mg via INTRAVENOUS
  Administered 2017-06-19: 60 mg via INTRAVENOUS
  Administered 2017-06-19 (×2): 20 mg via INTRAVENOUS

## 2017-06-19 MED ORDER — OXYCODONE HCL ER 10 MG PO T12A
10.0000 mg | EXTENDED_RELEASE_TABLET | Freq: Two times a day (BID) | ORAL | Status: DC
  Administered 2017-06-19 – 2017-06-21 (×4): 10 mg via ORAL
  Filled 2017-06-19 (×4): qty 1

## 2017-06-19 MED ORDER — OXYCODONE HCL 5 MG/5ML PO SOLN
5.0000 mg | Freq: Once | ORAL | Status: DC | PRN
Start: 2017-06-19 — End: 2017-06-19

## 2017-06-19 MED ORDER — KETOROLAC TROMETHAMINE 15 MG/ML IJ SOLN
15.0000 mg | Freq: Four times a day (QID) | INTRAMUSCULAR | Status: AC
  Administered 2017-06-19 – 2017-06-20 (×4): 15 mg via INTRAVENOUS
  Filled 2017-06-19 (×4): qty 1

## 2017-06-19 MED ORDER — ACETAMINOPHEN 650 MG RE SUPP: 650.0000 mg | RECTAL | Status: DC | PRN

## 2017-06-19 MED ORDER — HYDROMORPHONE HCL 1 MG/ML IJ SOLN
INTRAMUSCULAR | Status: AC
  Filled 2017-06-19: qty 1

## 2017-06-19 MED ORDER — DEXAMETHASONE SODIUM PHOSPHATE 10 MG/ML IJ SOLN
INTRAMUSCULAR | Status: DC | PRN
  Administered 2017-06-19: 10 mg via INTRAVENOUS

## 2017-06-19 MED ORDER — PROPOFOL 10 MG/ML IV BOLUS
INTRAVENOUS | Status: AC
  Filled 2017-06-19: qty 20

## 2017-06-19 MED ORDER — VANCOMYCIN HCL 10 G IV SOLR
1500.0000 mg | Freq: Once | INTRAVENOUS | Status: AC
  Administered 2017-06-20: 1500 mg via INTRAVENOUS
  Filled 2017-06-19: qty 1500

## 2017-06-19 MED ORDER — BUPIVACAINE LIPOSOME 1.3 % IJ SUSP
INTRAMUSCULAR | Status: DC | PRN
Start: 2017-06-19 — End: 2017-06-19
  Administered 2017-06-19: 20 mL

## 2017-06-19 MED ORDER — PHENYLEPHRINE HCL 10 MG/ML IJ SOLN
INTRAVENOUS | Status: DC | PRN
  Administered 2017-06-19: 10 ug/min via INTRAVENOUS

## 2017-06-19 MED ORDER — EPHEDRINE 5 MG/ML INJ
INTRAVENOUS | Status: AC
  Filled 2017-06-19: qty 10

## 2017-06-19 MED ORDER — FLEET ENEMA 7-19 GM/118ML RE ENEM: 1.0000 | ENEMA | Freq: Once | RECTAL | Status: DC | PRN

## 2017-06-19 MED ORDER — CHLORHEXIDINE GLUCONATE 4 % EX LIQD: 60.0000 mL | Freq: Once | CUTANEOUS | Status: DC

## 2017-06-19 MED ORDER — ONDANSETRON HCL 4 MG/2ML IJ SOLN
INTRAMUSCULAR | Status: AC
  Filled 2017-06-19: qty 2

## 2017-06-19 MED ORDER — SODIUM CHLORIDE 0.9 % IV SOLN
INTRAVENOUS | Status: DC
  Administered 2017-06-19 – 2017-06-21 (×2): via INTRAVENOUS

## 2017-06-19 MED ORDER — ONDANSETRON HCL 4 MG PO TABS: 4.0000 mg | ORAL_TABLET | Freq: Four times a day (QID) | ORAL | Status: DC | PRN

## 2017-06-19 MED ORDER — ALUM & MAG HYDROXIDE-SIMETH 200-200-20 MG/5ML PO SUSP: 30.0000 mL | Freq: Four times a day (QID) | ORAL | Status: DC | PRN

## 2017-06-19 MED ORDER — BUPIVACAINE HCL (PF) 0.5 % IJ SOLN
INTRAMUSCULAR | Status: AC
  Filled 2017-06-19: qty 30

## 2017-06-19 MED ORDER — BUPIVACAINE LIPOSOME 1.3 % IJ SUSP
20.0000 mL | INTRAMUSCULAR | Status: DC
  Filled 2017-06-19: qty 20

## 2017-06-19 MED ORDER — THROMBIN 5000 UNITS EX SOLR
CUTANEOUS | Status: AC
  Filled 2017-06-19: qty 5000

## 2017-06-19 MED ORDER — MIDAZOLAM HCL 2 MG/2ML IJ SOLN
INTRAMUSCULAR | Status: AC
  Filled 2017-06-19: qty 2

## 2017-06-19 MED ORDER — MIDAZOLAM HCL 5 MG/5ML IJ SOLN
INTRAMUSCULAR | Status: DC | PRN
  Administered 2017-06-19: 2 mg via INTRAVENOUS

## 2017-06-19 MED ORDER — POLYETHYLENE GLYCOL 3350 17 G PO PACK: 17.0000 g | PACK | Freq: Every day | ORAL | Status: DC | PRN

## 2017-06-19 MED ORDER — 0.9 % SODIUM CHLORIDE (POUR BTL) OPTIME
TOPICAL | Status: DC | PRN
  Administered 2017-06-19: 1000 mL

## 2017-06-19 MED ORDER — BISACODYL 5 MG PO TBEC: 5.0000 mg | DELAYED_RELEASE_TABLET | Freq: Every day | ORAL | Status: DC | PRN

## 2017-06-19 MED ORDER — GABAPENTIN 300 MG PO CAPS
300.0000 mg | ORAL_CAPSULE | Freq: Every day | ORAL | Status: DC
  Administered 2017-06-19 – 2017-06-20 (×2): 300 mg via ORAL
  Filled 2017-06-19 (×2): qty 1

## 2017-06-19 MED ORDER — ACETAMINOPHEN 325 MG PO TABS
650.0000 mg | ORAL_TABLET | ORAL | Status: DC | PRN
  Administered 2017-06-21: 650 mg via ORAL
  Filled 2017-06-19: qty 2

## 2017-06-19 MED ORDER — FENTANYL CITRATE (PF) 100 MCG/2ML IJ SOLN
INTRAMUSCULAR | Status: DC | PRN
  Administered 2017-06-19 (×2): 50 ug via INTRAVENOUS
  Administered 2017-06-19: 100 ug via INTRAVENOUS

## 2017-06-19 MED ORDER — OXYCODONE HCL 5 MG PO TABS: 5.0000 mg | ORAL_TABLET | Freq: Once | ORAL | Status: DC | PRN

## 2017-06-19 MED ORDER — THROMBIN 20000 UNITS EX KIT
PACK | CUTANEOUS | Status: DC | PRN
  Administered 2017-06-19: 20 mL via TOPICAL

## 2017-06-19 MED ORDER — ROCURONIUM BROMIDE 10 MG/ML (PF) SYRINGE
PREFILLED_SYRINGE | INTRAVENOUS | Status: AC
  Filled 2017-06-19: qty 20

## 2017-06-19 MED ORDER — SODIUM CHLORIDE 0.9 % IV SOLN: 250.0000 mL | INTRAVENOUS | Status: DC

## 2017-06-19 MED ORDER — ALBUTEROL SULFATE HFA 108 (90 BASE) MCG/ACT IN AERS
INHALATION_SPRAY | RESPIRATORY_TRACT | Status: DC | PRN
  Administered 2017-06-19 (×3): 4 via RESPIRATORY_TRACT

## 2017-06-19 MED ORDER — DOCUSATE SODIUM 100 MG PO CAPS
100.0000 mg | ORAL_CAPSULE | Freq: Two times a day (BID) | ORAL | Status: DC
  Administered 2017-06-19 – 2017-06-21 (×4): 100 mg via ORAL
  Filled 2017-06-19 (×4): qty 1

## 2017-06-19 MED ORDER — PANTOPRAZOLE SODIUM 40 MG IV SOLR
40.0000 mg | Freq: Every day | INTRAVENOUS | Status: DC
  Administered 2017-06-19: 40 mg via INTRAVENOUS
  Filled 2017-06-19: qty 40

## 2017-06-19 MED ORDER — SUGAMMADEX SODIUM 200 MG/2ML IV SOLN
INTRAVENOUS | Status: AC
  Filled 2017-06-19: qty 2

## 2017-06-19 MED ORDER — METHOCARBAMOL 1000 MG/10ML IJ SOLN
500.0000 mg | Freq: Four times a day (QID) | INTRAVENOUS | Status: DC | PRN
  Filled 2017-06-19: qty 5

## 2017-06-19 MED ORDER — SODIUM CHLORIDE 0.9% FLUSH
3.0000 mL | Freq: Two times a day (BID) | INTRAVENOUS | Status: DC
  Administered 2017-06-21: 3 mL via INTRAVENOUS

## 2017-06-19 MED ORDER — VANCOMYCIN HCL IN DEXTROSE 1-5 GM/200ML-% IV SOLN
INTRAVENOUS | Status: AC
  Filled 2017-06-19: qty 200

## 2017-06-19 MED ORDER — VANCOMYCIN HCL 1000 MG IV SOLR
INTRAVENOUS | Status: DC | PRN
  Administered 2017-06-19: 1000 mg via INTRAVENOUS

## 2017-06-19 MED ORDER — SODIUM CHLORIDE 0.9% FLUSH
3.0000 mL | INTRAVENOUS | Status: DC | PRN
  Administered 2017-06-21: 3 mL via INTRAVENOUS
  Filled 2017-06-19: qty 3

## 2017-06-19 MED ORDER — EPHEDRINE SULFATE-NACL 50-0.9 MG/10ML-% IV SOSY
PREFILLED_SYRINGE | INTRAVENOUS | Status: DC | PRN
Start: 2017-06-19 — End: 2017-06-19
  Administered 2017-06-19 (×2): 5 mg via INTRAVENOUS

## 2017-06-19 MED ORDER — ONDANSETRON HCL 4 MG/2ML IJ SOLN
INTRAMUSCULAR | Status: DC | PRN
  Administered 2017-06-19: 4 mg via INTRAVENOUS

## 2017-06-19 MED ORDER — METHOCARBAMOL 500 MG PO TABS
500.0000 mg | ORAL_TABLET | Freq: Four times a day (QID) | ORAL | Status: DC | PRN
  Administered 2017-06-21: 500 mg via ORAL
  Filled 2017-06-19: qty 1

## 2017-06-19 MED ORDER — SILDENAFIL CITRATE 20 MG PO TABS
20.0000 mg | ORAL_TABLET | Freq: Three times a day (TID) | ORAL | Status: DC | PRN
  Filled 2017-06-19: qty 1

## 2017-06-19 MED ORDER — LACTATED RINGERS IV SOLN
INTRAVENOUS | Status: DC
  Administered 2017-06-19 (×2): via INTRAVENOUS

## 2017-06-19 SURGICAL SUPPLY — 54 items
BENZOIN TINCTURE PRP APPL 2/3 (GAUZE/BANDAGES/DRESSINGS) ×2 IMPLANT
BUR ROUND FLUTED 4 SOFT TCH (BURR) IMPLANT
BUR SABER RD CUTTING 3.0 (BURR) ×4 IMPLANT
CANISTER SUCT 3000ML PPV (MISCELLANEOUS) ×2 IMPLANT
COVER MAYO STAND STRL (DRAPES) ×2 IMPLANT
DERMABOND ADVANCED (GAUZE/BANDAGES/DRESSINGS) ×1
DERMABOND ADVANCED .7 DNX12 (GAUZE/BANDAGES/DRESSINGS) ×1 IMPLANT
DRAPE C-ARM 42X72 X-RAY (DRAPES) IMPLANT
DRAPE HALF SHEET 40X57 (DRAPES) ×2 IMPLANT
DRAPE MICROSCOPE LEICA (MISCELLANEOUS) ×2 IMPLANT
DRAPE SURG 17X23 STRL (DRAPES) ×8 IMPLANT
DRSG MEPILEX BORDER 4X8 (GAUZE/BANDAGES/DRESSINGS) ×2 IMPLANT
DURAPREP 26ML APPLICATOR (WOUND CARE) ×2 IMPLANT
ELECT BLADE 4.0 EZ CLEAN MEGAD (MISCELLANEOUS) ×2
ELECT CAUTERY BLADE 6.4 (BLADE) ×2 IMPLANT
ELECT REM PT RETURN 9FT ADLT (ELECTROSURGICAL) ×2
ELECTRODE BLDE 4.0 EZ CLN MEGD (MISCELLANEOUS) ×1 IMPLANT
ELECTRODE REM PT RTRN 9FT ADLT (ELECTROSURGICAL) ×1 IMPLANT
GLOVE BIO SURGEON STRL SZ 6 (GLOVE) ×2 IMPLANT
GLOVE BIOGEL PI IND STRL 8 (GLOVE) ×1 IMPLANT
GLOVE BIOGEL PI INDICATOR 8 (GLOVE) ×1
GLOVE ECLIPSE 8.5 STRL (GLOVE) ×4 IMPLANT
GLOVE ORTHO TXT STRL SZ7.5 (GLOVE) ×2 IMPLANT
GLOVE SURG 8.5 LATEX PF (GLOVE) ×4 IMPLANT
GOWN STRL REUS W/ TWL LRG LVL3 (GOWN DISPOSABLE) ×1 IMPLANT
GOWN STRL REUS W/TWL 2XL LVL3 (GOWN DISPOSABLE) ×4 IMPLANT
GOWN STRL REUS W/TWL LRG LVL3 (GOWN DISPOSABLE) ×1
HEMOSTAT POWDER KIT SURGIFOAM (HEMOSTASIS) ×2 IMPLANT
KIT BASIN OR (CUSTOM PROCEDURE TRAY) ×2 IMPLANT
KIT ROOM TURNOVER OR (KITS) ×2 IMPLANT
NEEDLE SPNL 18GX3.5 QUINCKE PK (NEEDLE) ×4 IMPLANT
NS IRRIG 1000ML POUR BTL (IV SOLUTION) ×2 IMPLANT
PACK LAMINECTOMY ORTHO (CUSTOM PROCEDURE TRAY) ×2 IMPLANT
PAD ARMBOARD 7.5X6 YLW CONV (MISCELLANEOUS) ×4 IMPLANT
PATTIES SURGICAL .5 X.5 (GAUZE/BANDAGES/DRESSINGS) IMPLANT
PATTIES SURGICAL .75X.75 (GAUZE/BANDAGES/DRESSINGS) IMPLANT
SPONGE INTESTINAL PEANUT (DISPOSABLE) ×2 IMPLANT
SPONGE LAP 4X18 X RAY DECT (DISPOSABLE) ×4 IMPLANT
SPONGE SURGIFOAM ABS GEL 100 (HEMOSTASIS) ×2 IMPLANT
STRIP CLOSURE SKIN 1/2X4 (GAUZE/BANDAGES/DRESSINGS) ×2 IMPLANT
SUT VIC AB 0 CT1 18XCR BRD8 (SUTURE) ×1 IMPLANT
SUT VIC AB 0 CT1 8-18 (SUTURE) ×1
SUT VIC AB 1 CTX 36 (SUTURE) ×1
SUT VIC AB 1 CTX36XBRD ANBCTR (SUTURE) ×1 IMPLANT
SUT VIC AB 2-0 CT1 27 (SUTURE) ×2
SUT VIC AB 2-0 CT1 TAPERPNT 27 (SUTURE) ×2 IMPLANT
SUT VIC AB 3-0 X1 27 (SUTURE) ×2 IMPLANT
SUT VICRYL 0 UR6 27IN ABS (SUTURE) ×2 IMPLANT
SYR 20CC LL (SYRINGE) ×2 IMPLANT
SYR CONTROL 10ML LL (SYRINGE) ×2 IMPLANT
TOWEL OR 17X24 6PK STRL BLUE (TOWEL DISPOSABLE) ×2 IMPLANT
TOWEL OR 17X26 10 PK STRL BLUE (TOWEL DISPOSABLE) ×2 IMPLANT
WATER STERILE IRR 1000ML POUR (IV SOLUTION) ×2 IMPLANT
YANKAUER SUCT BULB TIP NO VENT (SUCTIONS) ×2 IMPLANT

## 2017-06-19 NOTE — Anesthesia Procedure Notes (Signed)
Procedure Name: Intubation Date/Time: 06/19/2017 1:27 PM Performed by: Carney Living Pre-anesthesia Checklist: Patient identified, Emergency Drugs available, Suction available, Patient being monitored and Timeout performed Patient Re-evaluated:Patient Re-evaluated prior to induction Oxygen Delivery Method: Circle system utilized Preoxygenation: Pre-oxygenation with 100% oxygen Induction Type: IV induction Ventilation: Mask ventilation without difficulty and Oral airway inserted - appropriate to patient size Laryngoscope Size: Mac and 4 Grade View: Grade I Tube type: Oral Tube size: 7.5 mm Number of attempts: 1 Airway Equipment and Method: Stylet Placement Confirmation: ETT inserted through vocal cords under direct vision,  positive ETCO2 and breath sounds checked- equal and bilateral Secured at: 23 cm Tube secured with: Tape Dental Injury: Teeth and Oropharynx as per pre-operative assessment

## 2017-06-19 NOTE — Discharge Instructions (Addendum)
    No lifting greater than 10 lbs. Avoid bending, stooping and twisting. Walk in house for first week them may start to get out slowly increasing distance up to one mile by 3 weeks post op. Keep incision dry for 3 days, may use tegaderm or similar water impervious dressing.  

## 2017-06-19 NOTE — Anesthesia Preprocedure Evaluation (Addendum)
Anesthesia Evaluation  Patient identified by MRN, date of birth, ID band Patient awake    Reviewed: Allergy & Precautions, NPO status , Patient's Chart, lab work & pertinent test results  Airway Mallampati: I  TM Distance: >3 FB Neck ROM: Full    Dental  (+) Edentulous Upper, Edentulous Lower, Dental Advisory Given   Pulmonary neg pulmonary ROS, former smoker,    breath sounds clear to auscultation       Cardiovascular negative cardio ROS   Rhythm:Regular Rate:Normal     Neuro/Psych negative neurological ROS  negative psych ROS   GI/Hepatic negative GI ROS, Neg liver ROS,   Endo/Other  negative endocrine ROS  Renal/GU negative Renal ROS  negative genitourinary   Musculoskeletal  (+) Arthritis , Osteoarthritis,    Abdominal (+) + obese,   Peds negative pediatric ROS (+)  Hematology negative hematology ROS (+)   Anesthesia Other Findings   Reproductive/Obstetrics negative OB ROS                            Anesthesia Physical Anesthesia Plan  ASA: II  Anesthesia Plan: General   Post-op Pain Management:    Induction: Intravenous  PONV Risk Score and Plan: 3 and Ondansetron, Dexamethasone, Midazolam, Propofol infusion and Treatment may vary due to age or medical condition  Airway Management Planned: Oral ETT  Additional Equipment:   Intra-op Plan:   Post-operative Plan: Extubation in OR  Informed Consent: I have reviewed the patients History and Physical, chart, labs and discussed the procedure including the risks, benefits and alternatives for the proposed anesthesia with the patient or authorized representative who has indicated his/her understanding and acceptance.   Dental advisory given  Plan Discussed with: CRNA, Anesthesiologist and Surgeon  Anesthesia Plan Comments:        Anesthesia Quick Evaluation

## 2017-06-19 NOTE — Op Note (Signed)
06/19/2017  6:23 PM  PATIENT:  Allen Henry.  61 y.o. male  MRN: [id number]  OPERATIVE REPORT  PRE-OPERATIVE DIAGNOSIS:  lumbar spinal stenosis right L3-4 and L5-S1, bilateral and central L4-5  POST-OPERATIVE DIAGNOSIS:  lumbar spinal stenosis right Lumbar three-four  and Lumbar five-Sacrum , bilateral and central Lumbar four-five  PROCEDURE:  Procedure(s): RIGHT LUMBAR THREE-FOUE AND LUMBAR FIVE-SACRAL ONE  PARTIAL HEMILAMINECTOMY, BILATERAL PARTIAL HEMILAMINECTOMY LUMBAR FOUR-FIVE    SURGEON:  Jessy Oto, MD     ASSISTANT:  Benjiman Core, PA-C  (Present throughout the entire procedure and necessary for completion of procedure in a timely manner)     ANESTHESIA:  General,    COMPLICATIONS:  None.     EBL:900cc  DRAINS:Foley placed in PACU.  PROCEDURE: The patient was met in the holding area, and the appropriate right L5-S1,L3-4 and bilateral L4-5 levels identified and marked with an "X" and my initials since this was a bilateral procedure. The patient was then transported to OR and was placed under general anesthesia without difficulty. Then placed on the operative table in a prone position. The [facility] spine OR table was used. The patient received appropriate preoperative antibiotic prophylaxis. Nursing staff inserted a Foley catheter under sterile conditions prior to turning. Standard prep with DuraPrep solution from the mid dorsal spine to the mid sacral level.Time-out procedure was called and correct .  Patient was draped in the usual manner. Iodine Vi-Drape was used. 2 x 18-gauge spinal needles were placed at the expected and of the expected levels. Intraoperative lateral xray demonstrated the needles placed posterior to the L5 and S1 levels. Initial incision was made at the upper needle extending cranially the expected level of L4-5 and L3-4  approximately 2 1/2inches in length. The incision were infiltrated with marcaine 1/2% plain:exparel 1.3% 1:1. Total of 20 cc  were used. Skin subcutaneous layers divide down to the lumbodorsal fascia this was incised in both  L3-4, L4-5 and L5-S1 spinous processes, clamps placed at the spinous process of L4. A lateral radiograph demonstrated the clamp at the L4 spinous process.The right L3-4 to L5-S1 levels were then exposed using Cobb elevators and electrocautery. These areas were then packed. Boss McCollough retractor was inserted at the incision site. Leksell rongeur used to remove a portion of  the entire spinous processes of L4 and superior 10% of L5. Leksell rongeur was then used to further remove bone down to the ligamentum flavum and the central portions of the lamina and base of the spinous process were thinned at L4. 4 mm burr was also used to thin the lamina of L4 centrally. The facets were then exposed out laterally and were very hypertrophic.Osteotomes then used medial aspect of the L4-5 facet bilaterally resecting approximately 10-15% of the facet bilaterally. Kerrisons were then used to resect about 60 percent of the central portions of the inferior lamina of L4. Ligamentum flavum at the L4-5 level was then carefully resected centrally preserving at least 7-8 mm with at the pars level L4. The central laminectomy was further widened performing more resection of the medial aspect of facet at L4-5. Note that loupe magnification and headlight was used for exposure of this portion procedure.The operating room microscope sterilely draped brought into the field and then carefully the right side L4-5 decompressed at the right L5 neuroforamen resecting bone over the superior lamina of L5 and decompressing the right L5 foramen and reflected portion ligamentum flavum off the medial aspect of the right L4-5 facet. Hockey-stick  nerve probe could be passed out both the L4 and L5 neuroforamen. Attention then turned to the left side of L4-5 and the left L5 and L4 neuroforamen debrided of hypertrophic ligamentuma flavum such that a hockey  stick probe could be passed out both of the left L4 and L5 neuroforamen. The right L3-4 leve then exposured moving the retractor superiorly on the right side. The right L3-4 medial facet then resected about 20 percent. The medial aspect of facet at the L3-4 level was then carefully evaluated and hypertrophic ligamentum flavum resected using Kerrisons decompressing the lateral recess on this right side. Hockey-stick nerve probe could be used to pass out the outer recess demonstrating patency and decompression of the L4 nerve root and L3 nerve roots.  Following this then a hockey-stick nerve probe could be passed out easily right  L3, and bilateral L4 and L5 neuroforamen. Following decompression bilaterally thrombin-soaked Gelfoam was applied to the laminotomy defects and these areas packed with small sponges. Under the operating room microscope, the right L5-S1 interspace carefully debrided the small amount of muscle attachment here and high-speed bur used to drill the medial aspect of the inferior articular process of L5-S1 approximately 10%. 3 mm Kerrison then used to enter the spinal canal over the superior aspect of the L5 lamina carefully using the Kerrison to debris the attachment as a curet. Foraminotomy was then performed over the right L5 nerve root beginning cranially and extending caudally on the right side performing a right hemilaminectomy.  The medial 10% superior articular process of L5 and then resected using an osteotome and 2 mm Kerrison. This allowed for identification of the thecal sac. Penfield 4 was then used to carefully mobilize the thecal sac medially and the S1 nerve root identified within the lateral recess flattened the hypertrophic ligamentum flavum. Carefully the lateral aspect of the S1 nerve root was identified and a Penfield 4 was used to mobilize the nerve medially such that the herniated disc was visible with microscope. The nerve root able to be retracted along the medial aspect  of the S1 pedicle and No disc herniation was present.   Hockey-stick nerve probe could then be passed out the right L3, L4, L5 and S1 neuroforamen and the left L4 and L5 neuroforamen. Venous bleeding encountered  Adequate hemostasis was obtained at all levels. The thrombin-soaked Gelfoam was then removed and a medium. The incision showed no active bleeding present.  Boss McCollough retractor was then removed. The end of the case, the central laminotomy at L4-5 was present with normal pulsation of the thecal sac present. Following additional irrigation and with no active bleeding present at any level level, the incision was then closed by first approximating the lumbar muscles the midline with interrupted #1 Vicryl sutures loosely the lumbodorsal fascia then approximated with interrupted #1 Vicryl sutures.  Additional MARCAINE 0.5% 1:1 EXPAREL 1.3%, Amount: 10 ml infiltrated the fascia.  Deep subcutaneous layers approximated with interrupted #0 Vicryl suture more superficial layers with interrupted 2-0 Vicryl suture the skin closed with a running subcutaneous stitch of 4-0 Vicryl. Dermabond was applied to the skin margins.  A mepilex bandage was applied to the midline incision site. All instrument and sponge counts were correct. Patient was then reactivated returned to supine position and extubated. He was then returned to recovery room in satisfactory condition. This case proceeded without complication but due to depth of incision and thickness of bone resected require more time and blood loss was greater than expected.  Benjiman Core, PA-C performed the duties of assistant surgeon throughout this case she assisted using loupe magnification and OR microscope retracting delicate neural structures suctioning over the neural structures throughout the case bilaterally . He was present from the beginning of the case to the end of the case. Assisted in positioning the patient and  the arm and legs. He also  participated in removal the patient from the operating table.           Sharyne Richters  06/19/2017, 6:23 PM

## 2017-06-19 NOTE — OR Nursing (Signed)
Bladder scan performed x3.  160-200 cc per scan.  No foley placed.

## 2017-06-19 NOTE — H&P (Signed)
Allen BrasilRaymond Kenneth Sneed Jr. is an 61 y.o. male.   Chief Complaint: Low back pain and right leg pain. HPI: Patient with history of multilevel lumbar spinal stenosis and the above complaint presents for surgical intervention. Progressively worsening symptoms. Failed conservative treatment.  Past Medical History:  Diagnosis Date  . Arthritis   . Headache   . Pneumonia   . Vertigo     Past Surgical History:  Procedure Laterality Date  . COLONOSCOPY    . TONSILLECTOMY      Family History  Problem Relation Age of Onset  . Heart attack Father   . Cancer Father   . Cancer Mother    Social History:  reports that he quit smoking about 15 years ago. His smoking use included Cigarettes. He has never used smokeless tobacco. He reports that he does not drink alcohol or use drugs.  Allergies:  Allergies  Allergen Reactions  . Penicillins     Not allergic    Medications Prior to Admission  Medication Sig Dispense Refill  . acetaminophen (TYLENOL) 500 MG tablet Take 500 mg by mouth every 6 (six) hours as needed.    . gabapentin (NEURONTIN) 300 MG capsule Take 1 capsule (300 mg total) by mouth at bedtime. 30 capsule 3  . Multiple Vitamins-Minerals (MULTIVITAMIN WITH MINERALS) tablet Take 1 tablet by mouth daily.    Marland Kitchen. oxaprozin (DAYPRO) 600 MG tablet Take 1 tablet (600 mg total) by mouth daily. 30 tablet 3  . sildenafil (REVATIO) 20 MG tablet Take 20 mg by mouth 3 (three) times daily as needed.       No results found for this or any previous visit (from the past 48 hour(s)). No results found.  Review of Systems  Constitutional: Negative.   HENT: Negative.   Respiratory: Negative.   Cardiovascular: Negative.   Genitourinary: Negative.   Musculoskeletal: Positive for back pain.  Neurological: Positive for tingling.  Psychiatric/Behavioral: Negative.     Blood pressure (!) 143/76, pulse 73, temperature 98.1 F (36.7 C), resp. rate 19, height 5\' 10"  (1.778 m), weight 286 lb (129.7  kg), SpO2 97 %. Physical Exam  Constitutional: He is oriented to person, place, and time. He appears well-developed. No distress.  HENT:  Head: Normocephalic and atraumatic.  Eyes: Pupils are equal, round, and reactive to light. EOM are normal.  Neck: Normal range of motion.  Respiratory: No respiratory distress.  GI: He exhibits no distension.  Musculoskeletal: He exhibits tenderness.  Neurological: He is alert and oriented to person, place, and time.  Skin: Skin is warm and dry.  Psychiatric: He has a normal mood and affect.     Assessment/Plan L3-4 right L4-5 and L5-S1 stenosis. Low back pain and  leg pain.  We will proceed with RIGHT L3-4 AND L5-S1 PARTIAL HEMILAMINECTOMY, BILATERAL PARTIAL HEMILAMINECTOMY L4-5  as scheduled. Surgical procedure along with possible risks and, patient discussed. All questions answered. Zonia KiefJames Owens, PA-C 06/19/2017, 12:48 PM

## 2017-06-19 NOTE — Progress Notes (Signed)
Pharmacy Antibiotic Note Allen BrasilRaymond Kenneth Cisek Jr. is a 61 y.o. male admitted on 06/19/2017 with surgical prophylaxis. Pharmacy has been consulted for vancomycin dosing.  Plan: 1. Vancomycin 1500 mg IV x 1 as no drain in place 2. Pharmacy to sign off; please re consult if needed   Height: 5\' 10"  (177.8 cm) Weight: 286 lb (129.7 kg) IBW/kg (Calculated) : 73  Temp (24hrs), Avg:98.2 F (36.8 C), Min:98.1 F (36.7 C), Max:98.5 F (36.9 C)  No results for input(s): WBC, CREATININE, LATICACIDVEN, VANCOTROUGH, VANCOPEAK, VANCORANDOM, GENTTROUGH, GENTPEAK, GENTRANDOM, TOBRATROUGH, TOBRAPEAK, TOBRARND, AMIKACINPEAK, AMIKACINTROU, AMIKACIN in the last 168 hours.  Estimated Creatinine Clearance: 114.3 mL/min (by C-G formula based on SCr of 0.93 mg/dL).    Allergies  Allergen Reactions  . Penicillins     Not allergic    Thank you for allowing pharmacy to be a part of this patient's care.  Pollyann SamplesAndy Eiman Maret, PharmD, BCPS 06/19/2017, 9:14 PM

## 2017-06-19 NOTE — Transfer of Care (Signed)
Immediate Anesthesia Transfer of Care Note  Patient: Allen BrasilRaymond Kenneth Rorie Jr.  Procedure(s) Performed: Procedure(s) with comments: RIGHT LUMBAR THREE-FOUE AND LUMBAR FIVE-SACRAL ONE  PARTIAL HEMILAMINECTOMY, BILATERAL PARTIAL HEMILAMINECTOMY LUMBAR FOUR-FIVE (N/A) - RIGHT Lumbar three-four AND Lumbar five-Sacrum one  PARTIAL HEMILAMINECTOMY, BILATERAL PARTIAL HEMILAMINECTOMY Lumbar four-five   Patient Location: PACU  Anesthesia Type:General  Level of Consciousness: awake  Airway & Oxygen Therapy: Patient Spontanous Breathing and Patient connected to nasal cannula oxygen  Post-op Assessment: Report given to RN and Post -op Vital signs reviewed and stable  Post vital signs: Reviewed and stable  Last Vitals:  Vitals:   06/19/17 1130 06/19/17 1807  BP: (!) 143/76 129/70  Pulse: 73 96  Resp: 19 16  Temp: 36.7 C 36.7 C  SpO2: 97% 91%    Last Pain:  Vitals:   06/19/17 1155  PainSc: 6          Complications: No apparent anesthesia complications

## 2017-06-19 NOTE — Interval H&P Note (Signed)
History and Physical Interval Note:  06/19/2017 1:07 PM  Allen Brasilaymond Kenneth Paul Jr.  has presented today for surgery, with the diagnosis of lumbar spinal stenosis right L3-4 and L5-S1, bilateral and central L4-5  The various methods of treatment have been discussed with the patient and family. After consideration of risks, benefits and other options for treatment, the patient has consented to  Procedure(s): RIGHT L3-4 AND L5-S1 PARTIAL HEMILAMINECTOMY, BILATERAL PARTIAL HEMILAMINECTOMY L4-5 (N/A) as a surgical intervention .  The patient's history has been reviewed, patient examined, no change in status, stable for surgery.  I have reviewed the patient's chart and labs.  Questions were answered to the patient's satisfaction.     Erline LevineJim Hollie Bartus

## 2017-06-19 NOTE — Brief Op Note (Signed)
06/19/2017  5:48 PM  PATIENT:  Allen Brasilaymond Kenneth Cedano Jr.  61 y.o. male  PRE-OPERATIVE DIAGNOSIS:  lumbar spinal stenosis right L3-4 and L5-S1, bilateral and central L4-5  POST-OPERATIVE DIAGNOSIS:  lumbar spinal stenosis right Lumbar three-four  and Lumbar five-Sacrum , bilateral and central Lumbar four-five  PROCEDURE:  Procedure(s) with comments: RIGHT LUMBAR THREE-FOUE AND LUMBAR FIVE-SACRAL ONE  PARTIAL HEMILAMINECTOMY, BILATERAL PARTIAL HEMILAMINECTOMY LUMBAR FOUR-FIVE (N/A) - RIGHT Lumbar three-four AND Lumbar five-Sacrum one  PARTIAL HEMILAMINECTOMY, BILATERAL PARTIAL HEMILAMINECTOMY Lumbar four-five   SURGEON:  Surgeon(s) and Role:    * Kerrin ChampagneNitka, Devynn Scheff E, MD - Primary  PHYSICIAN ASSISTANT: Zonia KiefJames Owens, PA-C  ANESTHESIA:   local and general, Dr. Jacklynn BueMassagee.  EBL:  Total I/O In: 1250 [I.V.:1000; IV Piggyback:250] Out: 750 [Blood:750]  BLOOD ADMINISTERED:none  DRAINS: None   LOCAL MEDICATIONS USED:  MARCAINE0.5% 1:1 EXPAREL 1.3% Amount: 20 ml  SPECIMEN:  No Specimen  DISPOSITION OF SPECIMEN:  N/A  COUNTS:  YES  TOURNIQUET:  * No tourniquets in log *  DICTATION: .Dragon Dictation  PLAN OF CARE: Admit to inpatient   PATIENT DISPOSITION:  PACU - hemodynamically stable.   Delay start of Pharmacological VTE agent (>24hrs) due to surgical blood loss or risk of bleeding: yes

## 2017-06-20 ENCOUNTER — Telehealth (INDEPENDENT_AMBULATORY_CARE_PROVIDER_SITE_OTHER): Payer: Self-pay | Admitting: *Deleted

## 2017-06-20 ENCOUNTER — Ambulatory Visit (INDEPENDENT_AMBULATORY_CARE_PROVIDER_SITE_OTHER): Payer: BLUE CROSS/BLUE SHIELD | Admitting: Specialist

## 2017-06-20 ENCOUNTER — Encounter (HOSPITAL_COMMUNITY): Payer: Self-pay | Admitting: Specialist

## 2017-06-20 LAB — CBC
HEMATOCRIT: 39.4 % (ref 39.0–52.0)
HEMOGLOBIN: 12.6 g/dL — AB (ref 13.0–17.0)
MCH: 30.1 pg (ref 26.0–34.0)
MCHC: 32 g/dL (ref 30.0–36.0)
MCV: 94.3 fL (ref 78.0–100.0)
Platelets: 221 10*3/uL (ref 150–400)
RBC: 4.18 MIL/uL — ABNORMAL LOW (ref 4.22–5.81)
RDW: 14.2 % (ref 11.5–15.5)
WBC: 13.9 10*3/uL — AB (ref 4.0–10.5)

## 2017-06-20 LAB — BASIC METABOLIC PANEL
ANION GAP: 4 — AB (ref 5–15)
BUN: 10 mg/dL (ref 6–20)
CO2: 27 mmol/L (ref 22–32)
Calcium: 8.7 mg/dL — ABNORMAL LOW (ref 8.9–10.3)
Chloride: 108 mmol/L (ref 101–111)
Creatinine, Ser: 0.8 mg/dL (ref 0.61–1.24)
GLUCOSE: 115 mg/dL — AB (ref 65–99)
Potassium: 4.5 mmol/L (ref 3.5–5.1)
Sodium: 139 mmol/L (ref 135–145)

## 2017-06-20 MED ORDER — PANTOPRAZOLE SODIUM 40 MG PO TBEC
40.0000 mg | DELAYED_RELEASE_TABLET | Freq: Every day | ORAL | Status: DC
  Administered 2017-06-20: 40 mg via ORAL
  Filled 2017-06-20: qty 1

## 2017-06-20 MED ORDER — INFLUENZA VAC SPLIT QUAD 0.5 ML IM SUSY
0.5000 mL | PREFILLED_SYRINGE | INTRAMUSCULAR | Status: DC
Start: 2017-06-21 — End: 2017-06-21

## 2017-06-20 MED FILL — Thrombin For Soln 20000 Unit: CUTANEOUS | Qty: 1 | Status: AC

## 2017-06-20 NOTE — Evaluation (Signed)
Occupational Therapy Evaluation Patient Details Name: Allen BrasilRaymond Kenneth Bromell Jr. MRN: [id number] DOB: 1956-08-26 Today's Date: 06/20/2017    History of Present Illness Pt is a 61 y.o. male s/p R L3-S1 partial hemilaminectomy & L4-5 bilat partial hemilaminectomy on 06/19/17. Pertinent PMH includes vertigo, arthritis.   Clinical Impression   This 61 y/o M presents with the above. At baseline Pt was independent with ADLs and functional mobility. Pt completed room level functional mobility, toilet transfer and standing grooming ADLs at RW level with overall MinGuard assist, currently requires MaxA for LB ADLs secondary to adhering to back precautions. Educated Pt and Pt's significant other on compensatory techniques and AE for completing ADLs while adhering to precautions with Pt demonstrating and verbalizing understanding. Pt plans to return home with spouse who is able to assist with ADL completion PRN. Education provided and questions answered throughout. No additional OT services identified at this time. Will sign off.     Follow Up Recommendations  No OT follow up;Supervision/Assistance - 24 hour    Equipment Recommendations  3 in 1 bedside commode           Precautions / Restrictions Precautions Precautions: Back Precaution Booklet Issued: Yes (comment) (issued by PT) Precaution Comments: No brace required; Pt able to recall 3/3 back precautions  Restrictions Weight Bearing Restrictions: No      Mobility Bed Mobility               General bed mobility comments: OOB in recliner, verbally reviewed log roll technique   Transfers Overall transfer level: Needs assistance Equipment used: Rolling walker (2 wheeled) Transfers: Sit to/from Stand Sit to Stand: Min guard         General transfer comment: Min guard for safety; cues for technique and hand placement with RW    Balance Overall balance assessment: Needs assistance Sitting-balance support: No upper extremity  supported;Feet supported Sitting balance-Leahy Scale: Good     Standing balance support: Bilateral upper extremity supported;No upper extremity supported;During functional activity Standing balance-Leahy Scale: Fair Standing balance comment: Able to static stand with no UE support during grooming ADLs                           ADL either performed or assessed with clinical judgement   ADL Overall ADL's : Needs assistance/impaired Eating/Feeding: Set up;Sitting   Grooming: Wash/dry hands;Wash/dry face;Brushing hair;Min guard;Standing   Upper Body Bathing: Min guard;Sitting   Lower Body Bathing: Minimal assistance;Sit to/from stand;Cueing for back precautions   Upper Body Dressing : Min guard;Sitting   Lower Body Dressing: Maximal assistance;Sit to/from stand;Cueing for back precautions;With caregiver independent assisting   Toilet Transfer: Min guard;Ambulation;BSC;RW Toilet Transfer Details (indicate cue type and reason): BSC over toilet  Toileting- Clothing Manipulation and Hygiene: Min guard;Sit to/from Nurse, children'sstand     Tub/Shower Transfer Details (indicate cue type and reason): educated on safe tub transfer techniques and use of 3:1 during task completion to increase safety  Functional mobility during ADLs: Min guard;Rolling walker General ADL Comments: educated Pt on compensatory techniques and AE for completing ADL tasks while adhering to back precautions; spouse present for education                          Pertinent Vitals/Pain Pain Assessment: Faces Faces Pain Scale: Hurts little more Pain Location: Lower back Pain Descriptors / Indicators: Guarding;Dull;Sore;Tightness Pain Intervention(s): Limited activity within patient's tolerance;Monitored during session;Repositioned  Extremity/Trunk Assessment Upper Extremity Assessment Upper Extremity Assessment: Overall WFL for tasks assessed   Lower Extremity Assessment Lower Extremity Assessment:  Overall WFL for tasks assessed       Communication Communication Communication: No difficulties   Cognition Arousal/Alertness: Awake/alert Behavior During Therapy: WFL for tasks assessed/performed Overall Cognitive Status: Within Functional Limits for tasks assessed                                     General Comments  spouse present during session                Home Living Family/patient expects to be discharged to:: Private residence Living Arrangements: Spouse/significant other Available Help at Discharge: Family;Available 24 hours/day Type of Home: House Home Access: Stairs to enter [employer] of Steps: 3 Entrance Stairs-Rails: Right Home Layout: Two level;Able to live on main level with bedroom/bathroom     Bathroom Shower/Tub: Chief Strategy Officer: Standard     Home Equipment: Hand held shower head          Prior Functioning/Environment Level of Independence: Independent                 OT Problem List: Decreased activity tolerance;Decreased knowledge of use of DME or AE;Decreased knowledge of precautions;Pain      OT Treatment/Interventions: Self-care/ADL training;DME and/or AE instruction;Balance training;Therapeutic activities;Therapeutic exercise;Energy conservation;Patient/family education    OT Goals(Current goals can be found in the care plan section) Acute Rehab OT Goals Patient Stated Goal: Return home OT Goal Formulation: With patient  OT Frequency: Min 2X/week                             AM-PAC PT "6 Clicks" Daily Activity     Outcome Measure Help from another person eating meals?: None Help from another person taking care of personal grooming?: A Little Help from another person toileting, which includes using toliet, bedpan, or urinal?: A Little Help from another person bathing (including washing, rinsing, drying)?: A Lot Help from another person to put on and taking off regular  upper body clothing?: None Help from another person to put on and taking off regular lower body clothing?: A Lot 6 Click Score: 18   End of Session Equipment Utilized During Treatment: Gait belt;Rolling walker Nurse Communication: Mobility status  Activity Tolerance: Patient tolerated treatment well Patient left: in chair;with call bell/phone within reach;with family/visitor present  OT Visit Diagnosis: Other abnormalities of gait and mobility (R26.89);Pain Pain - part of body:  (back )                Time: 1345-1419 OT Time Calculation (min): 34 min Charges:  OT General Charges $OT Visit: 1 Visit OT Evaluation $OT Eval Low Complexity: 1 Low OT Treatments $Self Care/Home Management : 8-22 mins G-Codes:     Marcy Siren, OT Pager [phone] 06/20/2017  Orlando Penner 06/20/2017, 3:17 PM

## 2017-06-20 NOTE — Progress Notes (Signed)
RN and CNA bladder scanned pt; bladder scan showed 120 mL.

## 2017-06-20 NOTE — Telephone Encounter (Signed)
[facility] wanting to know if it would be ok to can change the status of pt to observation, [facility].  Please CB at [phone]

## 2017-06-20 NOTE — Anesthesia Postprocedure Evaluation (Signed)
Anesthesia Post Note  Patient: Allen BrasilRaymond Kenneth Kruschke Jr.  Procedure(s) Performed: Procedure(s) (LRB): RIGHT LUMBAR THREE-FOUE AND LUMBAR FIVE-SACRAL ONE  PARTIAL HEMILAMINECTOMY, BILATERAL PARTIAL HEMILAMINECTOMY LUMBAR FOUR-FIVE (N/A)     Patient location during evaluation: PACU Anesthesia Type: General Level of consciousness: awake and alert Pain management: pain level controlled Vital Signs Assessment: post-procedure vital signs reviewed and stable Respiratory status: spontaneous breathing, nonlabored ventilation, respiratory function stable and patient connected to nasal cannula oxygen Cardiovascular status: blood pressure returned to baseline and stable Postop Assessment: no signs of nausea or vomiting Anesthetic complications: no    Last Vitals:  Vitals:   06/19/17 2100 06/20/17 0454  BP: 123/73 117/70  Pulse: 82 68  Resp: 15 16  Temp: 36.9 C 36.8 C  SpO2: 96% 99%    Last Pain:  Vitals:   06/20/17 0454  TempSrc: Oral  PainSc:                  Allen Henry

## 2017-06-20 NOTE — Evaluation (Signed)
Physical Therapy Evaluation Patient Details Name: Allen Henry. MRN: [id number] DOB: 01/20/1956 Today's Date: 06/20/2017   History of Present Illness  Pt is a 61 y.o. male s/p R L3-S1 partial hemilaminectomy & L4-5 bilat partial hemilaminectomy on 06/19/17. Pertinent PMH includes vertigo, arthritis.    Clinical Impression  Pt presents with pain and an overall decrease in functional mobility secondary to above. PTA, pt indep and lives at home with wife who is available for 24/7 assist upon intial d/c. Educ on precautions, positioning, and importance of mobility. Today, pt able to transfer and amb 310' with RW and min guard for safety; increased fatigue and pain with mobility. Pt would benefit from continued acute PT services to maximize functional mobility and independence prior to d/c with HHPT.     Follow Up Recommendations DC plan and follow up therapy as arranged by surgeon;Home health PT    Equipment Recommendations  Rolling walker with 5" wheels;3in1 (PT)    Recommendations for Other Services       Precautions / Restrictions Precautions Precautions: Back Precaution Booklet Issued: Yes (comment) Precaution Comments: No brace required Restrictions Weight Bearing Restrictions: No      Mobility  Bed Mobility Overal bed mobility: Needs Assistance Bed Mobility: Rolling;Sidelying to Sit Rolling: Min guard Sidelying to sit: Min guard       General bed mobility comments: Educ and cues for log roll technique with initial use of bed rail  Transfers Overall transfer level: Needs assistance Equipment used: Rolling walker (2 wheeled) Transfers: Sit to/from Stand Sit to Stand: Min guard         General transfer comment: Min guard for safety; cues for technique and hand placement with RW  Ambulation/Gait Ambulation/Gait assistance: Min guard Ambulation Distance (Feet): 310 Feet Assistive device: Rolling walker (2 wheeled) Gait Pattern/deviations: Step-through  pattern;Decreased stride length;Trunk flexed Gait velocity: Decreased Gait velocity interpretation: <1.8 ft/sec, indicative of risk for recurrent falls General Gait Details: Slow gait with forward flexed posture; cues for technique with RW and for upright posture. Took 1x standing rest break secondary to fatigue  Stairs            Wheelchair Mobility    Modified Rankin (Stroke Patients Only)       Balance Overall balance assessment: Needs assistance Sitting-balance support: No upper extremity supported;Feet supported Sitting balance-Leahy Scale: Good     Standing balance support: Bilateral upper extremity supported;No upper extremity supported;During functional activity Standing balance-Leahy Scale: Fair Standing balance comment: Able to static stand with no UE support                             Pertinent Vitals/Pain Pain Assessment: 0-10 Pain Score: 6  Pain Location: Lower back Pain Descriptors / Indicators: Guarding;Dull;Sore Pain Intervention(s): Limited activity within patient's tolerance;Monitored during session;RN gave pain meds during session    Home Living Family/patient expects to be discharged to:: Private residence Living Arrangements: Spouse/significant other Available Help at Discharge: Family;Available 24 hours/day Type of Home: House Home Access: Stairs to enter Entrance Stairs-Rails: Right Entrance Stairs-Number of Steps: 3 Home Layout: Two level;Able to live on main level with bedroom/bathroom        Prior Function Level of Independence: Independent               Hand Dominance        Extremity/Trunk Assessment   Upper Extremity Assessment Upper Extremity Assessment: Overall WFL for tasks assessed  Lower Extremity Assessment Lower Extremity Assessment: Overall WFL for tasks assessed       Communication   Communication: No difficulties  Cognition Arousal/Alertness: Awake/alert Behavior During Therapy: WFL for  tasks assessed/performed Overall Cognitive Status: Within Functional Limits for tasks assessed                                        General Comments      Exercises     Assessment/Plan    PT Assessment Patient needs continued PT services  PT Problem List Decreased strength;Decreased range of motion;Decreased activity tolerance;Decreased balance;Decreased mobility;Decreased knowledge of use of DME;Decreased knowledge of precautions;Pain       PT Treatment Interventions DME instruction;Gait training;Stair training;Functional mobility training;Therapeutic activities;Therapeutic exercise;Balance training;Patient/family education    PT Goals (Current goals can be found in the Care Plan section)  Acute Rehab PT Goals Patient Stated Goal: Return home PT Goal Formulation: With patient Time For Goal Achievement: 07/04/17 Potential to Achieve Goals: Good    Frequency Min 5X/week   Barriers to discharge        Co-evaluation               AM-PAC PT "6 Clicks" Daily Activity  Outcome Measure Difficulty turning over in bed (including adjusting bedclothes, sheets and blankets)?: None Difficulty moving from lying on back to sitting on the side of the bed? : A Little Difficulty sitting down on and standing up from a chair with arms (e.g., wheelchair, bedside commode, etc,.)?: A Little Help needed moving to and from a bed to chair (including a wheelchair)?: A Little Help needed walking in hospital room?: A Little Help needed climbing 3-5 steps with a railing? : A Little 6 Click Score: 19    End of Session Equipment Utilized During Treatment: Gait belt Activity Tolerance: Patient tolerated treatment well Patient left: in chair;with call bell/phone within reach;with family/visitor present Nurse Communication: Mobility status PT Visit Diagnosis: Other abnormalities of gait and mobility (R26.89);Pain Pain - part of body:  (Back)    Time: [id number] PT Time  Calculation (min) (ACUTE ONLY): 27 min   Charges:   PT Evaluation $PT Eval Low Complexity: 1 Low PT Treatments $Gait Training: 8-22 mins   PT G Codes:       Ina HomesJaclyn Jacque Byron, PT, DPT Acute Rehab Services  Pager: [phone]  Malachy ChamberJaclyn L Jasier Calabretta 06/20/2017, 10:30 AM

## 2017-06-20 NOTE — Progress Notes (Signed)
     Subjective: 1 Day Post-Op Procedure(s) (LRB): RIGHT LUMBAR THREE-FOUE AND LUMBAR FIVE-SACRAL ONE  PARTIAL HEMILAMINECTOMY, BILATERAL PARTIAL HEMILAMINECTOMY LUMBAR FOUR-FIVE (N/A)  Patient reports pain as moderate.    Objective:   VITALS:  Temp:  [98.1 F (36.7 C)-98.5 F (36.9 C)] 98.3 F (36.8 C) (09/13 0454) Pulse Rate:  [68-96] 68 (09/13 0454) Resp:  [13-26] 16 (09/13 0454) BP: (107-143)/(54-76) 117/70 (09/13 0454) SpO2:  [91 %-99 %] 99 % (09/13 0454) Weight:  [286 lb (129.7 kg)] 286 lb (129.7 kg) (09/12 1130) Awake alert and oriented x 4. Status of urinary output is not Clear. Apparently he did not void and an in and out cath was done. No numbness and no anesthesia.  Neurologically intact ABD soft Neurovascular intact Sensation intact distally Intact pulses distally Dorsiflexion/Plantar flexion intact Incision: scant drainage   LABS  Recent Labs  06/20/17 0310  HGB 12.6*  WBC 13.9*  PLT 221    Recent Labs  06/20/17 0310  NA 139  K 4.5  CL 108  CO2 27  BUN 10  CREATININE 0.80  GLUCOSE 115*   No results for input(s): LABPT, INR in the last 72 hours.   Assessment/Plan: 1 Day Post-Op Procedure(s) (LRB): RIGHT LUMBAR THREE-FOUE AND LUMBAR FIVE-SACRAL ONE  PARTIAL HEMILAMINECTOMY, BILATERAL PARTIAL HEMILAMINECTOMY LUMBAR FOUR-FIVE (N/A)  Advance diet Up with therapy  Erline LevineJim Jerl Munyan 06/20/2017, 9:05 AM Patient ID: Allen Brasilaymond Kenneth Kroboth Jr., male   DOB: [birth year], 61 y.o.   MRN: [id number]

## 2017-06-20 NOTE — Progress Notes (Signed)
Pt voided 100 mL. RN bladder scanned pt; bladder scan showed 124 mL.

## 2017-06-20 NOTE — Care Management Note (Addendum)
Case Management Note  Patient Details  Name: Allen Henry. MRN: [id number] Date of Birth: 10/03/1956  Subjective/Objective:                    Action/Plan:  Margarita Grizzle with [facility] returned call and accepted referral.  Met with aptient and wife at bedside. Offered choice they would like [facility], left Margarita Grizzle with [facility] voicemail awaiting call back. They do need walker and 3 in 1 Expected Discharge Date:                  Expected Discharge Plan:  [place]  In-House Referral:     Discharge planning Services  CM Consult  Post Acute Care Choice:  Durable Medical Equipment, Home Health Choice offered to:  Patient, Spouse  DME Arranged:  3-N-1, Walker rolling DME Agency:  [place]:  [place]:  [place]  Status of Service:  In process, will continue to follow  If discussed at Long Length of Stay Meetings, dates discussed:    Additional Comments:  Marilu Favre, RN 06/20/2017, 12:28 PM

## 2017-06-21 MED ORDER — POLYETHYLENE GLYCOL 3350 17 G PO PACK: 17.0000 g | PACK | Freq: Every day | ORAL | 0 refills | Status: AC | PRN

## 2017-06-21 MED ORDER — GABAPENTIN 300 MG PO CAPS: 300.0000 mg | ORAL_CAPSULE | Freq: Two times a day (BID) | ORAL | 2 refills | Status: DC

## 2017-06-21 MED ORDER — OXYCODONE HCL ER 10 MG PO T12A: 10.0000 mg | EXTENDED_RELEASE_TABLET | Freq: Two times a day (BID) | ORAL | 0 refills | Status: DC

## 2017-06-21 MED ORDER — DOCUSATE SODIUM 100 MG PO CAPS: 100.0000 mg | ORAL_CAPSULE | Freq: Two times a day (BID) | ORAL | 0 refills | Status: AC

## 2017-06-21 MED ORDER — SIMETHICONE 80 MG PO CHEW: 160.0000 mg | CHEWABLE_TABLET | Freq: Once | ORAL | 0 refills | Status: AC

## 2017-06-21 MED ORDER — SIMETHICONE 80 MG PO CHEW: 160.0000 mg | CHEWABLE_TABLET | Freq: Once | ORAL | Status: DC

## 2017-06-21 MED ORDER — METHOCARBAMOL 500 MG PO TABS: 500.0000 mg | ORAL_TABLET | Freq: Four times a day (QID) | ORAL | 2 refills | Status: DC | PRN

## 2017-06-21 MED ORDER — OXYCODONE HCL 5 MG PO TABS: 5.0000 mg | ORAL_TABLET | ORAL | 0 refills | Status: DC | PRN

## 2017-06-21 NOTE — Progress Notes (Signed)
     Subjective: 2 Days Post-Op Procedure(s) (LRB): RIGHT LUMBAR THREE-FOUE AND LUMBAR FIVE-SACRAL ONE  PARTIAL HEMILAMINECTOMY, BILATERAL PARTIAL HEMILAMINECTOMY LUMBAR FOUR-FIVE (N/A) Awake, alert and oriented x 4. PT has seen this AM and discharged from PT demonstrating ability to walk up and down stairs.  Patient reports pain as moderate.    Objective:   VITALS:  Temp:  [98.3 F (36.8 C)-98.5 F (36.9 C)] 98.3 F (36.8 C) (09/14 0621) Pulse Rate:  [67-77] 77 (09/14 0621) Resp:  [18] 18 (09/14 0621) BP: (104-127)/(59) 127/59 (09/14 0621) SpO2:  [96 %-98 %] 96 % (09/14 4782)  Neurologically intact ABD soft Neurovascular intact Sensation intact distally Intact pulses distally Dorsiflexion/Plantar flexion intact No cellulitis present   LABS  Recent Labs  06/20/17 0310  HGB 12.6*  WBC 13.9*  PLT 221    Recent Labs  06/20/17 0310  NA 139  K 4.5  CL 108  CO2 27  BUN 10  CREATININE 0.80  GLUCOSE 115*   No results for input(s): LABPT, INR in the last 72 hours.   Assessment/Plan: 2 Days Post-Op Procedure(s) (LRB): RIGHT LUMBAR THREE-FOUE AND LUMBAR FIVE-SACRAL ONE  PARTIAL HEMILAMINECTOMY, BILATERAL PARTIAL HEMILAMINECTOMY LUMBAR FOUR-FIVE (N/A)  Advance diet Up with therapy Discharge home with home health  Erline Levine 06/21/2017, 10:48 AM Patient ID: Allen Brasil., male   DOB: 11-16-1955, 61 y.o.   MRN: [id number]

## 2017-06-21 NOTE — Progress Notes (Signed)
Patient discharge teaching complete. Meds, diet, activity, incision care and follow up appointments reviewed and all questions answered. Copy of instructions and prescriptions given to patient.

## 2017-06-21 NOTE — Progress Notes (Signed)
Patient discharged home via wheelchair with wife. ?

## 2017-06-21 NOTE — Care Management Note (Signed)
Case Management Note  Patient Details  Name: Aloysuis Ribaudo. MRN: [id number] Date of Birth: 30-Aug-1956  Subjective/Objective:                    Action/Plan:  Verified w wife at bedside DC plan to go home today, [facility] PT provided through [facility], Lori w [facility] notifed and will deliver RW 3/1 to room before DC.  Expected Discharge Date:                  Expected Discharge Plan:  Home w Home Health Services  In-House Referral:     Discharge planning Services  CM Consult  Post Acute Care Choice:  Durable Medical Equipment, Home Health Choice offered to:  Patient, Spouse  DME Arranged:  3-N-1, Walker rolling DME Agency:  [facility]  HH Arranged:  PT HH Agency:  [facility]  Status of Service:  Completed, signed off  If discussed at Long Length of Stay Meetings, dates discussed:    Additional Comments:  Lawerance Sabal, RN 06/21/2017, 9:50 AM

## 2017-06-21 NOTE — Progress Notes (Signed)
Physical Therapy Treatment Patient Details Name: Allen Henry. MRN: [id number] DOB: 10/11/1955 Today's Date: 06/21/2017    History of Present Illness Pt is a 61 y.o. male s/p R L3-S1 partial hemilaminectomy & L4-5 bilat partial hemilaminectomy on 06/19/17. Pertinent PMH includes vertigo, arthritis.   PT Comments    Pt progressing well with mobility. Is mod indep with transfers and amb 500' with RW; stair training with BUE support of single rail and supervision to simulate home environment. Pt has met acute PT goals and all education completed. Reviewed back precautions, fall risk reduction, and importance of mobility. Encouraged pt to amb in hallway with wife as able throughout the rest of today. Feel pt is safe to return home with intermittent supervision from wife and HHPT. Will d/c acute PT.    Follow Up Recommendations  DC plan and follow up therapy as arranged by surgeon;Home health PT     Equipment Recommendations  Rolling walker with 5" wheels;3in1 (PT)    Recommendations for Other Services       Precautions / Restrictions Precautions Precautions: Back Precaution Booklet Issued: No Precaution Comments: No brace required; Pt able to recall 3/3 back precautions  Restrictions Weight Bearing Restrictions: No    Mobility  Bed Mobility Overal bed mobility: Modified Independent Bed Mobility: Rolling;Sidelying to Sit;Sit to Supine Rolling: Modified independent (Device/Increase time) Sidelying to sit: Modified independent (Device/Increase time)   Sit to supine: Modified independent (Device/Increase time)   General bed mobility comments: Pt with good log roll technique  Transfers Overall transfer level: Modified independent Equipment used: Rolling walker (2 wheeled) Transfers: Sit to/from Stand Sit to Stand: Modified independent (Device/Increase time)         General transfer comment: Good technique wtih RW  Ambulation/Gait Ambulation/Gait assistance:  Modified independent (Device/Increase time) Ambulation Distance (Feet): 500 Feet Assistive device: Rolling walker (2 wheeled) Gait Pattern/deviations: Step-through pattern;Decreased stride length;Trunk flexed Gait velocity: Decreased Gait velocity interpretation: <1.8 ft/sec, indicative of risk for recurrent falls General Gait Details: Slow, controlled gait secondary to c/o bilat leg/hip soreness; pt encouraged to maintain upright posture with RW. Good technique overall   Stairs Stairs: Yes   Stair Management: One rail Right;Alternating pattern;Sideways;Forwards Number of Stairs: 5 General stair comments: Ascend/descended 5 steps x2 with bilat UE support on single rail; educ on technique and fall risk reduction  Wheelchair Mobility    Modified Rankin (Stroke Patients Only)       Balance Overall balance assessment: Needs assistance Sitting-balance support: No upper extremity supported;Feet supported Sitting balance-Leahy Scale: Good     Standing balance support: Bilateral upper extremity supported;No upper extremity supported;During functional activity Standing balance-Leahy Scale: Fair Standing balance comment: Able to static stand with no UE support                             Cognition Arousal/Alertness: Awake/alert Behavior During Therapy: WFL for tasks assessed/performed Overall Cognitive Status: Within Functional Limits for tasks assessed                                        Exercises      General Comments        Pertinent Vitals/Pain Pain Assessment: 0-10 Pain Score: 3  Pain Location: Lower back Pain Descriptors / Indicators: Discomfort;Sore;Guarding Pain Intervention(s): Limited activity within patient's tolerance;Monitored during session    Home Living  Prior Function            PT Goals (current goals can now be found in the care plan section) Acute Rehab PT Goals Patient Stated Goal:  Return home PT Goal Formulation: With patient Time For Goal Achievement: 07/04/17 Potential to Achieve Goals: Good Progress towards PT goals: Goals met/education completed, patient discharged from PT    Frequency    Min 5X/week      PT Plan      Co-evaluation              AM-PAC PT "6 Clicks" Daily Activity  Outcome Measure  Difficulty turning over in bed (including adjusting bedclothes, sheets and blankets)?: None Difficulty moving from lying on back to sitting on the side of the bed? : None Difficulty sitting down on and standing up from a chair with arms (e.g., wheelchair, bedside commode, etc,.)?: None Help needed moving to and from a bed to chair (including a wheelchair)?: None Help needed walking in hospital room?: None Help needed climbing 3-5 steps with a railing? : A Little 6 Click Score: 23    End of Session Equipment Utilized During Treatment: Gait belt Activity Tolerance: Patient tolerated treatment well Patient left: in bed;with call bell/phone within reach;with family/visitor present Nurse Communication: Mobility status PT Visit Diagnosis: Other abnormalities of gait and mobility (R26.89);Pain Pain - part of body:  (back)     Time: 7225-7505 PT Time Calculation (min) (ACUTE ONLY): 28 min  Charges:  $Gait Training: 23-37 mins                    G Codes:      Mabeline Caras, PT, DPT Acute Rehab Services  Pager: [place] 06/21/2017, 9:10 AM

## 2017-06-25 NOTE — Telephone Encounter (Signed)
Patient has been discharged.

## 2017-06-26 ENCOUNTER — Ambulatory Visit (INDEPENDENT_AMBULATORY_CARE_PROVIDER_SITE_OTHER): Payer: BLUE CROSS/BLUE SHIELD | Admitting: Specialist

## 2017-07-02 NOTE — Discharge Summary (Signed)
Patient ID: Allen Henry. MRN: [id number] DOB/AGE: 1956/01/07 61 y.o.  Admit date: 06/19/2017 Discharge date: 07/02/2017  Admission Diagnoses:  Principal Problem:   Spinal stenosis of lumbar region Active Problems:   Status post lumbar laminectomy   Discharge Diagnoses:  Principal Problem:   Spinal stenosis of lumbar region Active Problems:   Status post lumbar laminectomy  status post Procedure(s): RIGHT LUMBAR THREE-FOUE AND LUMBAR FIVE-SACRAL ONE  PARTIAL HEMILAMINECTOMY, BILATERAL PARTIAL HEMILAMINECTOMY LUMBAR FOUR-FIVE  Past Medical History:  Diagnosis Date  . Arthritis   . Headache   . Pneumonia   . Vertigo     Surgeries: Procedure(s): RIGHT LUMBAR THREE-FOUE AND LUMBAR FIVE-SACRAL ONE  PARTIAL HEMILAMINECTOMY, BILATERAL PARTIAL HEMILAMINECTOMY LUMBAR FOUR-FIVE on 06/19/2017   Consultants:   Discharged Condition: Improved  Hospital Course: Allen Henry. is an 61 y.o. male who was admitted 06/19/2017 for operative treatment of Spinal stenosis of lumbar region. Patient failed conservative treatments (please see the history and physical for the specifics) and had severe unremitting pain that affects sleep, daily activities and work/hobbies. After pre-op clearance, the patient was taken to the operating room on 06/19/2017 and underwent  Procedure(s): RIGHT LUMBAR THREE-FOUE AND LUMBAR FIVE-SACRAL ONE  PARTIAL HEMILAMINECTOMY, BILATERAL PARTIAL HEMILAMINECTOMY LUMBAR FOUR-FIVE.    Patient was given perioperative antibiotics:  Anti-infectives    Start     Dose/Rate Route Frequency Ordered Stop   06/20/17 0100  vancomycin (VANCOCIN) 1,500 mg in sodium chloride 0.9 % 500 mL IVPB     1,500 mg 250 mL/hr over 120 Minutes Intravenous  Once 06/19/17 2113 06/20/17 0327   06/19/17 1305  vancomycin (VANCOCIN) 1-5 GM/200ML-% IVPB    Comments:  Kirt Boys   : cabinet override      06/19/17 1305 06/20/17 0114       Patient was given sequential  compression devices and early ambulation to prevent DVT.   Patient benefited maximally from hospital stay and there were no complications. At the time of discharge, the patient was urinating/moving their bowels without difficulty, tolerating a regular diet, pain is controlled with oral pain medications and they have been cleared by PT/OT.   Recent vital signs: No data found.    Recent laboratory studies: No results for input(s): WBC, HGB, HCT, PLT, NA, K, CL, CO2, BUN, CREATININE, GLUCOSE, INR, CALCIUM in the last 72 hours.  Invalid input(s): PT, 2   Discharge Medications:   Allergies as of 06/21/2017      Reactions   Penicillins    Not allergic      Medication List    STOP taking these medications   oxaprozin 600 MG tablet Commonly known as:  DAYPRO     TAKE these medications   acetaminophen 500 MG tablet Commonly known as:  TYLENOL Take 500 mg by mouth every 6 (six) hours as needed.   docusate sodium 100 MG capsule Commonly known as:  COLACE Take 1 capsule (100 mg total) by mouth 2 (two) times daily.   gabapentin 300 MG capsule Commonly known as:  NEURONTIN Take 1 capsule (300 mg total) by mouth at bedtime. What changed:  Another medication with the same name was added. Make sure you understand how and when to take each.   gabapentin 300 MG capsule Commonly known as:  NEURONTIN Take 1 capsule (300 mg total) by mouth 2 (two) times daily. What changed:  You were already taking a medication with the same name, and this prescription was added. Make sure you understand  how and when to take each.   methocarbamol 500 MG tablet Commonly known as:  ROBAXIN Take 1 tablet (500 mg total) by mouth every 6 (six) hours as needed for muscle spasms.   multivitamin with minerals tablet Take 1 tablet by mouth daily.   oxyCODONE 5 MG immediate release tablet Commonly known as:  Oxy IR/ROXICODONE Take 1-2 tablets (5-10 mg total) by mouth every 4 (four) hours as needed for  breakthrough pain.   oxyCODONE 10 mg 12 hr tablet Commonly known as:  OXYCONTIN Take 1 tablet (10 mg total) by mouth every 12 (twelve) hours.   polyethylene glycol packet Commonly known as:  MIRALAX / GLYCOLAX Take 17 g by mouth daily as needed for mild constipation.   sildenafil 20 MG tablet Commonly known as:  REVATIO Take 20 mg by mouth 3 (three) times daily as needed.   simethicone 80 MG chewable tablet Commonly known as:  MYLICON Chew 2 tablets (160 mg total) by mouth once.            Discharge Care Instructions        Start     Ordered   06/21/17 0000  docusate sodium (COLACE) 100 MG capsule  2 times daily     06/21/17 1101   06/21/17 0000  gabapentin (NEURONTIN) 300 MG capsule  2 times daily     06/21/17 1101   06/21/17 0000  methocarbamol (ROBAXIN) 500 MG tablet  Every 6 hours PRN     06/21/17 1101   06/21/17 0000  oxyCODONE (OXY IR/ROXICODONE) 5 MG immediate release tablet  Every 4 hours PRN     06/21/17 1101   06/21/17 0000  oxyCODONE (OXYCONTIN) 10 mg 12 hr tablet  Every 12 hours     06/21/17 1101   06/21/17 0000  polyethylene glycol (MIRALAX / GLYCOLAX) packet  Daily PRN     06/21/17 1101   06/21/17 0000  Call MD / Call 911    Comments:  If you experience chest pain or shortness of breath, CALL 911 and be transported to the hospital emergency room.  If you develope a fever above 101 F, pus (white drainage) or increased drainage or redness at the wound, or calf pain, call your surgeon's office.   06/21/17 1101   06/21/17 0000  Constipation Prevention    Comments:  Drink plenty of fluids.  Prune juice may be helpful.  You may use a stool softener, such as Colace (over the counter) 100 mg twice a day.  Use MiraLax (over the counter) for constipation as needed.   06/21/17 1101   06/21/17 0000  Increase activity slowly as tolerated     06/21/17 1101   06/21/17 0000  Discharge instructions    Comments:  No lifting greater than 10 lbs. Avoid bending, stooping  and twisting. Walk in house for first week them may start to get out slowly increasing distance up to one mile by 3 weeks post op. Keep incision dry for 3 days, may use tegaderm or similar water impervious dressing.   06/21/17 1101   06/21/17 0000  Driving restrictions    Comments:  No driving for 3 weeks   19/14/78 1101   06/21/17 0000  Lifting restrictions    Comments:  No lifting for 8 weeks   06/21/17 1101   06/21/17 0000  Diet Carb Modified     06/21/17 1101   06/21/17 0000  simethicone (MYLICON) 80 MG chewable tablet   Once     06/21/17  1235      Diagnostic Studies: Dg Lumbar Spine 2-3 Views  Addendum Date: 06/19/2017   ADDENDUM REPORT: 06/19/2017 18:09 ADDENDUM: I was just contacted regarding the addition of a third intraoperative image to this series, labeled image #3 at 1534 hours. Additionally, a duplicate account in [facility] PACS for this patient has been located with a comparison lumbar radiographic series 01/30/2017. Normal lumbar segmentation demonstrated on 01/30/2017. Image # 3 at 1534 hours demonstrates a surgical probe directed at the posterosuperior L5 endplate level. CONCLUSION: Additional intraoperative image at 1534 hours demonstrates localization of the posterosuperior L5 endplate. Electronically Signed   By: Odessa Fleming M.D.   On: 06/19/2017 18:09   Result Date: 06/19/2017 CLINICAL DATA:  L3-S1 hemi laminectomies. EXAM: LUMBAR SPINE - 2-3 VIEW COMPARISON:  None. FINDINGS: Two intraoperative radiographs are submitted. First image demonstrates needles at L4 and L5 spinous processes. The second image demonstrates a clamp at the L4 spinous process. IMPRESSION: Intraoperative localization of the L4 spinous process. Electronically Signed: By: Marin Roberts M.D. On: 06/19/2017 15:20    Discharge Instructions    Call MD / Call 911    Complete by:  As directed    If you experience chest pain or shortness of breath, CALL 911 and be transported to the hospital emergency room.   If you develope a fever above 101 F, pus (white drainage) or increased drainage or redness at the wound, or calf pain, call your surgeon's office.   Constipation Prevention    Complete by:  As directed    Drink plenty of fluids.  Prune juice may be helpful.  You may use a stool softener, such as Colace (over the counter) 100 mg twice a day.  Use MiraLax (over the counter) for constipation as needed.   Diet Carb Modified    Complete by:  As directed    Discharge instructions    Complete by:  As directed    No lifting greater than 10 lbs. Avoid bending, stooping and twisting. Walk in house for first week them may start to get out slowly increasing distance up to one mile by 3 weeks post op. Keep incision dry for 3 days, may use tegaderm or similar water impervious dressing.   Driving restrictions    Complete by:  As directed    No driving for 3 weeks   Increase activity slowly as tolerated    Complete by:  As directed    Lifting restrictions    Complete by:  As directed    No lifting for 8 weeks      Follow-up Information    Kerrin Champagne, MD Follow up in 2 week(s).   Specialty:  Orthopedic Surgery Why:  For wound re-check Contact information: [address] [place] [address] [phone]        [facility] Follow up.   Why:  For [facility] PT, they will deliver RW 3/1 to room prior to DC.  Contact information: [address] [place] [address] [phone]           Discharge Plan:  discharge to home  Disposition:     Signed: Zonia Kief  07/02/2017, 11:23 AM

## 2017-07-04 ENCOUNTER — Encounter (INDEPENDENT_AMBULATORY_CARE_PROVIDER_SITE_OTHER): Payer: Self-pay | Admitting: Specialist

## 2017-07-04 ENCOUNTER — Ambulatory Visit (INDEPENDENT_AMBULATORY_CARE_PROVIDER_SITE_OTHER): Payer: BLUE CROSS/BLUE SHIELD | Admitting: Specialist

## 2017-07-04 VITALS — BP 116/62 | HR 70 | Ht 70.5 in | Wt 291.0 lb

## 2017-07-04 DIAGNOSIS — M7581 Other shoulder lesions, right shoulder: Secondary | ICD-10-CM

## 2017-07-04 DIAGNOSIS — M25511 Pain in right shoulder: Secondary | ICD-10-CM

## 2017-07-04 DIAGNOSIS — M778 Other enthesopathies, not elsewhere classified: Secondary | ICD-10-CM

## 2017-07-04 DIAGNOSIS — G8929 Other chronic pain: Secondary | ICD-10-CM

## 2017-07-04 DIAGNOSIS — Z9889 Other specified postprocedural states: Secondary | ICD-10-CM

## 2017-07-04 NOTE — Patient Instructions (Signed)
Plan:Avoid bending, stooping and avoid lifting weights greater than 10 lbs. Avoid prolong standing and walking. Avoid frequent bending and stooping   May use ice or moist heat for pain. Weight loss is of benefit. Handicap license is approved.

## 2017-07-04 NOTE — Progress Notes (Signed)
   Post-Op Visit Note   Patient: Allen Henry.           Date of Birth: Sep 26, 1956           MRN: [id number] Visit Date: 07/04/2017 PCP: Marvis Repress, MD   Assessment & Plan:2 weeks post lumbar laminectomies, right L3-4, and L5-S1.  Chief Complaint:  Chief Complaint  Patient presents with  . Lower Back - Routine Post Op  No leg numbness or tingling. Had abdomenal pain and nausea and vomitting and diarrhea for 3 days. Spoke with  Doctor on call and stopped narcotics with improvement in all his symptoms Visit Diagnoses: No diagnosis found.  Plan:Avoid bending, stooping and avoid lifting weights greater than 10 lbs. Avoid prolong standing and walking. Avoid frequent bending and stooping   May use ice or moist heat for pain. Weight loss is of benefit. Handicap license is approved.   Follow-Up Instructions: No Follow-up on file.   Orders:  No orders of the defined types were placed in this encounter.  No orders of the defined types were placed in this encounter.   Imaging: No results found.  PMFS History: Patient Active Problem List   Diagnosis Date Noted  . Spinal stenosis of lumbar region 06/19/2017    Priority: High    Class: Chronic  . Status post lumbar laminectomy 06/19/2017   Past Medical History:  Diagnosis Date  . Arthritis   . Headache   . Pneumonia   . Vertigo     Family History  Problem Relation Age of Onset  . Heart attack Father   . Cancer Father   . Cancer Mother     Past Surgical History:  Procedure Laterality Date  . COLONOSCOPY    . LUMBAR LAMINECTOMY N/A 06/19/2017   Procedure: RIGHT LUMBAR THREE-FOUE AND LUMBAR FIVE-SACRAL ONE  PARTIAL HEMILAMINECTOMY, BILATERAL PARTIAL HEMILAMINECTOMY LUMBAR FOUR-FIVE;  Surgeon: Kerrin Champagne, MD;  Location: [facility] OR;  Service: Orthopedics;  Laterality: N/A;  RIGHT Lumbar three-four AND Lumbar five-Sacrum one  PARTIAL HEMILAMINECTOMY, BILATERAL PARTIAL HEMILAMINECTOMY Lumbar four-five   .  TONSILLECTOMY     Social History   Occupational History  . Not on file.   Social History Main Topics  . Smoking status: Former Smoker    Types: Cigarettes    Quit date: 06/12/2002  . Smokeless tobacco: Never Used  . Alcohol use No  . Drug use: No  . Sexual activity: Not on file

## 2017-07-12 ENCOUNTER — Telehealth (INDEPENDENT_AMBULATORY_CARE_PROVIDER_SITE_OTHER): Payer: Self-pay | Admitting: Specialist

## 2017-07-12 NOTE — Telephone Encounter (Signed)
ok 

## 2017-07-12 NOTE — Telephone Encounter (Signed)
Elnita Maxwell from [facility] called stating that the patient is receiving home health PT. CB # [phone]

## 2017-07-31 ENCOUNTER — Ambulatory Visit (INDEPENDENT_AMBULATORY_CARE_PROVIDER_SITE_OTHER): Payer: BLUE CROSS/BLUE SHIELD | Admitting: Orthopedic Surgery

## 2017-07-31 ENCOUNTER — Encounter (INDEPENDENT_AMBULATORY_CARE_PROVIDER_SITE_OTHER): Payer: Self-pay | Admitting: Orthopedic Surgery

## 2017-07-31 ENCOUNTER — Ambulatory Visit (INDEPENDENT_AMBULATORY_CARE_PROVIDER_SITE_OTHER): Payer: BLUE CROSS/BLUE SHIELD | Admitting: Specialist

## 2017-07-31 DIAGNOSIS — M25511 Pain in right shoulder: Secondary | ICD-10-CM | POA: Diagnosis not present

## 2017-07-31 DIAGNOSIS — M7551 Bursitis of right shoulder: Secondary | ICD-10-CM

## 2017-07-31 DIAGNOSIS — G8929 Other chronic pain: Secondary | ICD-10-CM | POA: Diagnosis not present

## 2017-07-31 MED ORDER — BUPIVACAINE HCL 0.5 % IJ SOLN
9.0000 mL | INTRAMUSCULAR | Status: AC | PRN
  Administered 2017-07-31: 9 mL via INTRA_ARTICULAR

## 2017-07-31 MED ORDER — METHYLPREDNISOLONE ACETATE 40 MG/ML IJ SUSP
40.0000 mg | INTRAMUSCULAR | Status: AC | PRN
  Administered 2017-07-31: 40 mg via INTRA_ARTICULAR

## 2017-07-31 MED ORDER — LIDOCAINE HCL 1 % IJ SOLN
5.0000 mL | INTRAMUSCULAR | Status: AC | PRN
  Administered 2017-07-31: 5 mL

## 2017-07-31 NOTE — Progress Notes (Signed)
Office Visit Note   Patient: Allen BrasilRaymond Kenneth Bukowski Jr.           Date of Birth: 08/16/1956           MRN: [id number] Visit Date: 07/31/2017 Requested by: Marvis RepressSchaeffer, Stanley, MD [address] [place], [place] [id number] PCP: Marvis RepressSchaeffer, Stanley, MD  Subjective: Chief Complaint  Patient presents with  . Right Shoulder - Pain    HPI: Allen Henry is a 61 year old patient with right shoulder pain.  Denies any history of injury.  He has had pain in the shoulder for several years and it is getting worse.  Reports some radiation down to the elbow.  He denies any neck pain.  Reports occasional numbness and tingling in the forearm.  He takes pain medications for his back.  Back surgery done 2 months ago and he is doing well with that.  He has done physical therapy for the shoulder.  Has not had an injection yet.  He worked at [employer] where he did very physical type work.  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  His symptoms have progressed since that time              ROS: All systems reviewed are negative as they relate to the chief complaint within the history of present illness.  Patient denies  fevers or chills.   Assessment & Plan: Visit Diagnoses:  1. Chronic right shoulder pain     Plan: Impression is possible rotator cuff full-thickness tear based on progression of symptoms from the time of his MRI scan last year.  Plan MRI arthrogram right shoulder to evaluate for rotator cuff tear along with subacromial injection for symptom relief.  I'll see him back after that study.  Continue with below shoulder level rotator cuff strengthening exercises.  No acromioclavicular joint tenderness today on exam  Follow-Up Instructions: Return for after MRI.   Orders:  Orders Placed This Encounter  Procedures  . MR SHOULDER RIGHT W CONTRAST  . Arthrogram   No orders of the defined types were placed in this  encounter.     Procedures: Large Joint Inj Date/Time: 07/31/2017 8:55 PM Performed by: Cammy CopaEAN, GREGORY SCOTT Authorized by: Cammy CopaEAN, GREGORY SCOTT   Consent Given by:  Patient Site marked: the procedure site was marked   Timeout: prior to procedure the correct patient, procedure, and site was verified   Indications:  Pain and diagnostic evaluation Location:  Shoulder Site:  R subacromial bursa Prep: patient was prepped and draped in usual sterile fashion   Needle Size:  18 G Needle Length:  1.5 inches Approach:  Posterior Ultrasound Guidance: No   Fluoroscopic Guidance: No   Arthrogram: No   Medications:  5 mL lidocaine 1 %; 9 mL bupivacaine 0.5 %; 40 mg methylPREDNISolone acetate 40 MG/ML Aspiration Attempted: No   Patient tolerance:  Patient tolerated the procedure well with no immediate complications     Clinical Data: No additional findings.  Objective: Vital Signs: There were no vitals taken for this visit.  Physical Exam:   Constitutional: Patient appears well-developed HEENT:  Head: Normocephalic Eyes:EOM are normal Neck: Normal range of motion Cardiovascular: Normal rate Pulmonary/chest: Effort normal Neurologic: Patient is alert Skin: Skin is warm Psychiatric: Patient has normal mood and affect    Ortho Exam: Orthopedic exam demonstrates good cervical spine range of motion.  Positive impingement signs on the right negative on the left.  No discrete acromioclavicular joint tenderness on the right-hand side.  No masses lymph adenopathy or skin changes noted in the right shoulder region.  He does have a little supraspinatus weakness on the right compared to the left but good subscap and infraspinatus testing.  Negative O'Brien's testing positive impingement signs on the right negative apprehension relocation testing on the right-hand side.  Specialty Comments:  No specialty comments available.  Imaging: No results found.   PMFS History: Patient Active  Problem List   Diagnosis Date Noted  . Spinal stenosis of lumbar region 06/19/2017    Class: Chronic  . Status post lumbar laminectomy 06/19/2017   Past Medical History:  Diagnosis Date  . Arthritis   . Headache   . Pneumonia   . Vertigo     Family History  Problem Relation Age of Onset  . Heart attack Father   . Cancer Father   . Cancer Mother     Past Surgical History:  Procedure Laterality Date  . COLONOSCOPY    . LUMBAR LAMINECTOMY N/A 06/19/2017   Procedure: RIGHT LUMBAR THREE-FOUE AND LUMBAR FIVE-SACRAL ONE  PARTIAL HEMILAMINECTOMY, BILATERAL PARTIAL HEMILAMINECTOMY LUMBAR FOUR-FIVE;  Surgeon: Kerrin Champagne, MD;  Location: [facility] OR;  Service: Orthopedics;  Laterality: N/A;  RIGHT Lumbar three-four AND Lumbar five-Sacrum one  PARTIAL HEMILAMINECTOMY, BILATERAL PARTIAL HEMILAMINECTOMY Lumbar four-five   . TONSILLECTOMY     Social History   Occupational History  . Not on file.   Social History Main Topics  . Smoking status: Former Smoker    Types: Cigarettes    Quit date: 06/12/2002  . Smokeless tobacco: Never Used  . Alcohol use No  . Drug use: No  . Sexual activity: Not on file

## 2017-08-01 ENCOUNTER — Ambulatory Visit (INDEPENDENT_AMBULATORY_CARE_PROVIDER_SITE_OTHER): Payer: BLUE CROSS/BLUE SHIELD | Admitting: Surgery

## 2017-08-01 ENCOUNTER — Encounter (INDEPENDENT_AMBULATORY_CARE_PROVIDER_SITE_OTHER): Payer: Self-pay | Admitting: Surgery

## 2017-08-01 VITALS — BP 136/81 | HR 70

## 2017-08-01 DIAGNOSIS — Z9889 Other specified postprocedural states: Secondary | ICD-10-CM

## 2017-08-01 MED ORDER — HYDROCODONE-ACETAMINOPHEN 5-325 MG PO TABS: 1.0000 | ORAL_TABLET | Freq: Three times a day (TID) | ORAL | 0 refills | Status: DC | PRN

## 2017-08-01 NOTE — Progress Notes (Signed)
   Post-Op Visit Note   Patient: Allen BrasilRaymond Kenneth Grinnell Jr.           Date of Birth: [birth year]           MRN: [id number] Visit Date: 08/01/2017 PCP: Marvis RepressSchaeffer, Stanley, MD   Assessment & Plan:  Chief Complaint:  Chief Complaint  Patient presents with  . Follow-up    4 week   Patient returns. About 6 weeks status post lumbar decompression. States that preop symptoms are greatly improved. He is pleased with this point. States that he's been summoned for jury duty on December fifth 2018. States that he needs a note for this.  Visit Diagnoses:  1. Status post lumbar spine surgery for decompression of spinal cord     Plan: Patient doing well. We'll follow-up in 6 weeks for recheck. Given a prescription for Norco. Avoid bending twisting heavy lifting.  Follow-Up Instructions: Return in about 6 weeks (around 09/12/2017).   Orders:  No orders of the defined types were placed in this encounter.  Meds ordered this encounter  Medications  . HYDROcodone-acetaminophen (NORCO/VICODIN) 5-325 MG tablet    Sig: Take 1 tablet by mouth every 8 (eight) hours as needed for moderate pain.    Dispense:  50 tablet    Refill:  0    Did not fill this prescription until out of other narcotic medication    Imaging: No results found.  PMFS History: Patient Active Problem List   Diagnosis Date Noted  . Spinal stenosis of lumbar region 06/19/2017    Class: Chronic  . Status post lumbar laminectomy 06/19/2017   Past Medical History:  Diagnosis Date  . Arthritis   . Headache   . Pneumonia   . Vertigo     Family History  Problem Relation Age of Onset  . Heart attack Father   . Cancer Father   . Cancer Mother     Past Surgical History:  Procedure Laterality Date  . COLONOSCOPY    . LUMBAR LAMINECTOMY N/A 06/19/2017   Procedure: RIGHT LUMBAR THREE-FOUE AND LUMBAR FIVE-SACRAL ONE  PARTIAL HEMILAMINECTOMY, BILATERAL PARTIAL HEMILAMINECTOMY LUMBAR FOUR-FIVE;  Surgeon: Kerrin ChampagneNitka, Ayvah Caroll E, MD;   Location: [facility] OR;  Service: Orthopedics;  Laterality: N/A;  RIGHT Lumbar three-four AND Lumbar five-Sacrum one  PARTIAL HEMILAMINECTOMY, BILATERAL PARTIAL HEMILAMINECTOMY Lumbar four-five   . TONSILLECTOMY     Social History   Occupational History  . Not on file.   Social History Main Topics  . Smoking status: Former Smoker    Types: Cigarettes    Quit date: 06/12/2002  . Smokeless tobacco: Never Used  . Alcohol use No  . Drug use: No  . Sexual activity: Not on file   Exam Surgical incision is well-healed. Neurologically intact. No focal motor deficits.

## 2017-08-07 ENCOUNTER — Ambulatory Visit (INDEPENDENT_AMBULATORY_CARE_PROVIDER_SITE_OTHER): Payer: BLUE CROSS/BLUE SHIELD | Admitting: Orthopedic Surgery

## 2017-08-14 ENCOUNTER — Ambulatory Visit (INDEPENDENT_AMBULATORY_CARE_PROVIDER_SITE_OTHER): Payer: BLUE CROSS/BLUE SHIELD | Admitting: Orthopedic Surgery

## 2017-08-19 ENCOUNTER — Other Ambulatory Visit (INDEPENDENT_AMBULATORY_CARE_PROVIDER_SITE_OTHER): Payer: Self-pay | Admitting: Surgery

## 2017-08-19 NOTE — Telephone Encounter (Signed)
Rx refill Hydrocodone/Oxycodone

## 2017-08-20 NOTE — Telephone Encounter (Signed)
Loretha BrasilLawson,Bj Kenneth Jr. Nov 19, 1955  RX  refill Hydrocodone/Oxycodone

## 2017-08-27 ENCOUNTER — Inpatient Hospital Stay: Admission: RE | Admit: 2017-08-27 | Payer: BLUE CROSS/BLUE SHIELD | Source: Ambulatory Visit

## 2017-08-27 ENCOUNTER — Inpatient Hospital Stay
Admission: RE | Admit: 2017-08-27 | Discharge: 2017-08-27 | Disposition: A | Discharge status: Home / Self Care | Payer: BLUE CROSS/BLUE SHIELD | Source: Ambulatory Visit | Attending: Orthopedic Surgery | Admitting: Orthopedic Surgery

## 2017-09-02 ENCOUNTER — Ambulatory Visit (INDEPENDENT_AMBULATORY_CARE_PROVIDER_SITE_OTHER): Payer: BLUE CROSS/BLUE SHIELD | Admitting: Orthopedic Surgery

## 2017-09-02 ENCOUNTER — Encounter (INDEPENDENT_AMBULATORY_CARE_PROVIDER_SITE_OTHER): Payer: Self-pay

## 2017-09-10 ENCOUNTER — Other Ambulatory Visit (INDEPENDENT_AMBULATORY_CARE_PROVIDER_SITE_OTHER): Payer: Self-pay | Admitting: Specialist

## 2017-09-10 NOTE — Telephone Encounter (Signed)
Methocarbamol refill request 

## 2017-09-18 ENCOUNTER — Encounter (INDEPENDENT_AMBULATORY_CARE_PROVIDER_SITE_OTHER): Payer: Self-pay | Admitting: Specialist

## 2017-09-18 ENCOUNTER — Ambulatory Visit (INDEPENDENT_AMBULATORY_CARE_PROVIDER_SITE_OTHER): Payer: BLUE CROSS/BLUE SHIELD | Admitting: Specialist

## 2017-09-18 DIAGNOSIS — M48062 Spinal stenosis, lumbar region with neurogenic claudication: Secondary | ICD-10-CM

## 2017-09-18 DIAGNOSIS — Z9889 Other specified postprocedural states: Secondary | ICD-10-CM

## 2017-09-18 NOTE — Progress Notes (Signed)
   Post-Op Visit Note   Patient: Allen BrasilRaymond Kenneth Manso Jr.           Date of Birth: July 01, 1956           MRN: [id number] Visit Date: 09/18/2017 PCP: Marvis RepressSchaeffer, Stanley, MD   Assessment & Plan:3 months post right L3-4 and L5-S1 hemilaminectomy, bilateral L4-5 hemilaminectomies.   Chief Complaint: No chief complaint on file. SLR with some crampiing in the left quadriceps, Strength is normal without deficit.  EHL and foot DF strength is normal, quad strength is normal . Usingthe cane today especially with inclimate weather  Visit Diagnoses:  1. Spinal stenosis of lumbar region with neurogenic claudication   2. S/P lumbar laminectomy     Plan:Avoid bending, stooping and avoid lifting weights greater than 10 lbs. Avoid prolong standing and walking. Avoid frequent bending and stooping  No lifting greater than 10 lbs. May use ice or moist heat for pain. Weight loss is of benefit. Handicap license is approved.   Follow-Up Instructions: Return in about 6 weeks (around 10/30/2017).   Orders:  No orders of the defined types were placed in this encounter.  No orders of the defined types were placed in this encounter.   Imaging: No results found.  PMFS History: Patient Active Problem List   Diagnosis Date Noted  . Spinal stenosis of lumbar region 06/19/2017    Priority: High    Class: Chronic  . Status post lumbar laminectomy 06/19/2017   Past Medical History:  Diagnosis Date  . Arthritis   . Headache   . Pneumonia   . Vertigo     Family History  Problem Relation Age of Onset  . Heart attack Father   . Cancer Father   . Cancer Mother     Past Surgical History:  Procedure Laterality Date  . COLONOSCOPY    . LUMBAR LAMINECTOMY N/A 06/19/2017   Procedure: RIGHT LUMBAR THREE-FOUE AND LUMBAR FIVE-SACRAL ONE  PARTIAL HEMILAMINECTOMY, BILATERAL PARTIAL HEMILAMINECTOMY LUMBAR FOUR-FIVE;  Surgeon: Kerrin ChampagneNitka, Shameeka Silliman E, MD;  Location: [facility] OR;  Service: Orthopedics;  Laterality: N/A;   RIGHT Lumbar three-four AND Lumbar five-Sacrum one  PARTIAL HEMILAMINECTOMY, BILATERAL PARTIAL HEMILAMINECTOMY Lumbar four-five   . TONSILLECTOMY     Social History   Occupational History  . Not on file  Tobacco Use  . Smoking status: Former Smoker    Types: Cigarettes    Last attempt to quit: 06/12/2002    Years since quitting: 15.2  . Smokeless tobacco: Never Used  Substance and Sexual Activity  . Alcohol use: No  . Drug use: No  . Sexual activity: Not on file

## 2017-09-18 NOTE — Patient Instructions (Signed)
Plan: Avoid bending, stooping and avoid lifting weights greater than 10 lbs. Avoid prolong standing and walking. Avoid frequent bending and stooping  No lifting greater than 10 lbs. May use ice or moist heat for pain. Weight loss is of benefit. Handicap license is approved. 

## 2017-09-27 ENCOUNTER — Other Ambulatory Visit (INDEPENDENT_AMBULATORY_CARE_PROVIDER_SITE_OTHER): Payer: Self-pay | Admitting: Specialist

## 2017-09-27 ENCOUNTER — Ambulatory Visit (INDEPENDENT_AMBULATORY_CARE_PROVIDER_SITE_OTHER): Payer: BLUE CROSS/BLUE SHIELD | Admitting: Orthopedic Surgery

## 2017-09-27 MED ORDER — HYDROCODONE-ACETAMINOPHEN 5-325 MG PO TABS: 1.0000 | ORAL_TABLET | Freq: Three times a day (TID) | ORAL | 0 refills | Status: DC | PRN

## 2017-09-27 MED ORDER — METHOCARBAMOL 500 MG PO TABS: ORAL_TABLET | ORAL | 0 refills | Status: DC

## 2017-10-02 ENCOUNTER — Ambulatory Visit
Admission: RE | Admit: 2017-10-02 | Discharge: 2017-10-02 | Disposition: A | Discharge status: Home / Self Care | Payer: BLUE CROSS/BLUE SHIELD | Source: Ambulatory Visit | Attending: Orthopedic Surgery | Admitting: Orthopedic Surgery

## 2017-10-02 DIAGNOSIS — M25511 Pain in right shoulder: Principal | ICD-10-CM

## 2017-10-02 DIAGNOSIS — G8929 Other chronic pain: Secondary | ICD-10-CM

## 2017-10-02 MED ORDER — IOPAMIDOL (ISOVUE-M 200) INJECTION 41%
15.0000 mL | Freq: Once | INTRAMUSCULAR | Status: AC
  Administered 2017-10-02: 15 mL via INTRA_ARTICULAR

## 2017-10-04 NOTE — Progress Notes (Signed)
Office Visit Note   Patient: Allen BrasilRaymond Kenneth Hauss Jr.           Date of Birth: [birth year]           MRN: [id number] Visit Date: 03/20/2017              Requested by: No referring provider defined for this encounter. PCP: Marvis RepressSchaeffer, Stanley, MD   Assessment & Plan: Visit Diagnoses:  1. Radiculopathy, lumbar region     Plan: With patient's ongoing symptoms and failed conservative treatment we'll schedule lumbar MRI to rule out HNP/stenosis. Follow-up  after completion to discuss results and further treatment options.  Follow-Up Instructions: Return in about 3 weeks (around 04/10/2017) for For review of lumbar MRI with Dr. Otelia Sergeantnitka.   Orders:  Orders Placed This Encounter  Procedures  . MR Lumbar Spine w/o contrast   No orders of the defined types were placed in this encounter.     Procedures: No procedures performed   Clinical Data: No additional findings.   Subjective: Chief Complaint  Patient presents with  . Lower Back - Follow-up    HPI Patient returns with ongoing low back pain, right lower extremity radiculopathy and neurogenic claudication symptoms. He has gone through physical therapy without any improvement. Currently working 2 days per week. Symptoms aggravated when he is lifting. 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 No complaints of bowel or bladder incontinence. Review of Systems No current cardiac pulmonary GI GU issues  Objective: Vital Signs: BP 124/76 (BP Location: Left Arm, Patient Position: Sitting)   Pulse 72   Ht 5\' 11"  (1.803 m)   Wt 300 lb (136.1 kg)   BMI 41.84 kg/m   Physical Exam  Constitutional: He is oriented to person, place, and time. No distress.  HENT:  Head: Normocephalic and atraumatic.  Eyes: EOM are normal. Pupils are equal, round, and reactive to light.  Neck: Normal range of motion. Neck supple.  Pulmonary/Chest: No respiratory distress.    Musculoskeletal:  Gait antalgic. Lumbar paraspinal tenderness. Positive right straight leg raise. Negative logroll.  Bilateral calves are nontender. Neurovascular intact.  Neurological: He is alert and oriented to person, place, and time.    Ortho Exam  Specialty Comments:  No specialty comments available.  Imaging: No results found.   PMFS History: Patient Active Problem List   Diagnosis Date Noted  . Spinal stenosis of lumbar region 06/19/2017    Class: Chronic  . Status post lumbar laminectomy 06/19/2017   Past Medical History:  Diagnosis Date  . Arthritis   . Headache   . Pneumonia   . Vertigo     Family History  Problem Relation Age of Onset  . Heart attack Father   . Cancer Father   . Cancer Mother     Past Surgical History:  Procedure Laterality Date  . COLONOSCOPY    . LUMBAR LAMINECTOMY N/A 06/19/2017   Procedure: RIGHT LUMBAR THREE-FOUE AND LUMBAR FIVE-SACRAL ONE  PARTIAL HEMILAMINECTOMY, BILATERAL PARTIAL HEMILAMINECTOMY LUMBAR FOUR-FIVE;  Surgeon: Kerrin ChampagneNitka, Joeziah Voit E, MD;  Location: [facility];  Service: Orthopedics;  Laterality: N/A;  RIGHT Lumbar three-four AND Lumbar five-Sacrum one  PARTIAL HEMILAMINECTOMY, BILATERAL PARTIAL HEMILAMINECTOMY Lumbar four-five   . TONSILLECTOMY     Social History   Occupational History  . Not on file  Tobacco Use  . Smoking status: Former Smoker    Types: Cigarettes  Last attempt to quit: 06/12/2002    Years since quitting: 15.3  . Smokeless tobacco: Never Used  Substance and Sexual Activity  . Alcohol use: No  . Drug use: No  . Sexual activity: Not on file

## 2017-10-05 ENCOUNTER — Other Ambulatory Visit (INDEPENDENT_AMBULATORY_CARE_PROVIDER_SITE_OTHER): Payer: Self-pay | Admitting: Specialist

## 2017-10-07 NOTE — Telephone Encounter (Signed)
Gabapentin refill request 

## 2017-10-16 ENCOUNTER — Ambulatory Visit (INDEPENDENT_AMBULATORY_CARE_PROVIDER_SITE_OTHER): Payer: BLUE CROSS/BLUE SHIELD | Admitting: Orthopedic Surgery

## 2017-10-16 ENCOUNTER — Encounter (INDEPENDENT_AMBULATORY_CARE_PROVIDER_SITE_OTHER): Payer: Self-pay

## 2017-10-31 ENCOUNTER — Ambulatory Visit (INDEPENDENT_AMBULATORY_CARE_PROVIDER_SITE_OTHER): Payer: BLUE CROSS/BLUE SHIELD | Admitting: Specialist

## 2017-11-08 ENCOUNTER — Ambulatory Visit (INDEPENDENT_AMBULATORY_CARE_PROVIDER_SITE_OTHER): Payer: BLUE CROSS/BLUE SHIELD | Admitting: Specialist

## 2017-11-08 ENCOUNTER — Encounter (INDEPENDENT_AMBULATORY_CARE_PROVIDER_SITE_OTHER): Payer: Self-pay | Admitting: Specialist

## 2017-11-08 VITALS — BP 131/81 | HR 71 | Ht 70.0 in | Wt 291.0 lb

## 2017-11-08 DIAGNOSIS — M4726 Other spondylosis with radiculopathy, lumbar region: Secondary | ICD-10-CM | POA: Diagnosis not present

## 2017-11-08 DIAGNOSIS — M5136 Other intervertebral disc degeneration, lumbar region: Secondary | ICD-10-CM

## 2017-11-08 MED ORDER — HYDROCODONE-ACETAMINOPHEN 5-325 MG PO TABS: 1.0000 | ORAL_TABLET | Freq: Three times a day (TID) | ORAL | 0 refills | Status: DC | PRN

## 2017-11-08 MED ORDER — MELOXICAM 15 MG PO TABS: 15.0000 mg | ORAL_TABLET | Freq: Every day | ORAL | 3 refills | Status: DC

## 2017-11-08 MED ORDER — METHOCARBAMOL 500 MG PO TABS: ORAL_TABLET | ORAL | 0 refills | Status: DC

## 2017-11-08 NOTE — Patient Instructions (Addendum)
Avoid frequent bending and stooping  No lifting greater than 10 lbs. May use ice or moist heat for pain. Weight loss is of benefit. Handicap license is approved. 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

## 2017-11-08 NOTE — Progress Notes (Signed)
Office Visit Note   Patient: Allen BrasilRaymond Kenneth Stencil Jr.           Date of Birth: Jul 28, 1956           MRN: [id number] Visit Date: 11/08/2017              Requested by: Marvis RepressSchaeffer, Stanley, MD [address] [place], [place] [id number] PCP: Marvis RepressSchaeffer, Stanley, MD   Assessment & Plan: Visit Diagnoses:  1. Other spondylosis with radiculopathy, lumbar region   2. Degenerative disc disease, lumbar     Plan: Avoid frequent bending and stooping  No lifting greater than 10 lbs. May use ice or moist heat for pain. Weight loss is of benefit. Handicap license is approved. 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  Follow-Up Instructions: Return in about 4 weeks (around 12/06/2017).   Orders:  No orders of the defined types were placed in this encounter.  Meds ordered this encounter  Medications  . HYDROcodone-acetaminophen (NORCO/VICODIN) 5-325 MG tablet    Sig: Take 1 tablet by mouth every 8 (eight) hours as needed for moderate pain.    Dispense:  50 tablet    Refill:  0    Did not fill this prescription until out of other narcotic medication  . meloxicam (MOBIC) 15 MG tablet    Sig: Take 1 tablet (15 mg total) by mouth daily.    Dispense:  90 tablet    Refill:  3  . methocarbamol (ROBAXIN) 500 MG tablet    Sig: TAKE 1 TABLET BY MOUTH EVERY 6 HOURS AS NEEDED FOR MUSCLE SPASMS    Dispense:  40 tablet    Refill:  0      Procedures: No procedures performed   Clinical Data: No additional findings.   Subjective: Chief Complaint  Patient presents with  . Lower Back - Follow-up    62 year old male with pain in the back with rotating to the left when on the bed and leaning and rolling to the left he has pain, and can not tolerate rolling to the left to cuddle his wife. He has to get off the left side. No bowel or bladder difficulty. Passing the water he is having to go twice early in the morning. Sometime walking helps  and changing postions help. He feels like the surgery and then having to help out with his wife increased the stresses on his back. 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    Review of Systems  Constitutional: Negative.   HENT: Negative.   Eyes: Negative.   Respiratory: Negative.   Cardiovascular: Negative.   Gastrointestinal: Negative.   Endocrine: Negative.   Genitourinary: Negative.   Musculoskeletal: Negative.   Skin: Negative.   Allergic/Immunologic: Negative.   Neurological: Negative.   Hematological: Negative.   Psychiatric/Behavioral: Negative.      Objective: Vital Signs: BP 131/81 (BP Location: Left Arm, Patient Position: Sitting)   Pulse 71   Ht 5\' 10"  (1.778 m)   Wt 291 lb (132 kg)   BMI 41.75 kg/m   Physical Exam  Constitutional: He is oriented to person, place, and time. He appears well-developed and well-nourished.  HENT:  Head: Normocephalic and atraumatic.  Eyes: EOM are normal. Pupils are equal, round, and reactive to light.  Neck: Normal range of motion. Neck supple.  Pulmonary/Chest: Effort normal and breath  sounds normal.  Abdominal: Soft. Bowel sounds are normal.  Neurological: He is alert and oriented to person, place, and time.  Skin: Skin is warm and dry.  Psychiatric: He has a normal mood and affect. His behavior is normal. Judgment and thought content normal.    Back Exam   Tenderness  The patient is experiencing tenderness in the lumbar.  Range of Motion  Extension: normal  Flexion: abnormal  Lateral bend right: normal  Lateral bend left: normal  Rotation right: normal  Rotation left: normal   Muscle Strength  Right Quadriceps:  4/5  Left Quadriceps:  5/5  Right Hamstrings:  5/5  Left Hamstrings:  5/5   Tests  Straight leg raise right: negative Straight leg raise left: negative  Other  Toe walk: normal Heel walk: normal Gait: normal    Erythema: no back redness Scars: present      Specialty Comments:  No specialty comments available.  Imaging: No results found.   PMFS History: Patient Active Problem List   Diagnosis Date Noted  . Spinal stenosis of lumbar region 06/19/2017    Priority: High    Class: Chronic  . Status post lumbar laminectomy 06/19/2017   Past Medical History:  Diagnosis Date  . Arthritis   . Headache   . Pneumonia   . Vertigo     Family History  Problem Relation Age of Onset  . Heart attack Father   . Cancer Father   . Cancer Mother     Past Surgical History:  Procedure Laterality Date  . COLONOSCOPY    . LUMBAR LAMINECTOMY N/A 06/19/2017   Procedure: RIGHT LUMBAR THREE-FOUE AND LUMBAR FIVE-SACRAL ONE  PARTIAL HEMILAMINECTOMY, BILATERAL PARTIAL HEMILAMINECTOMY LUMBAR FOUR-FIVE;  Surgeon: Kerrin Champagne, MD;  Location: [facility] OR;  Service: Orthopedics;  Laterality: N/A;  RIGHT Lumbar three-four AND Lumbar five-Sacrum one  PARTIAL HEMILAMINECTOMY, BILATERAL PARTIAL HEMILAMINECTOMY Lumbar four-five   . TONSILLECTOMY     Social History   Occupational History  . Not on file  Tobacco Use  . Smoking status: Former Smoker    Types: Cigarettes    Last attempt to quit: 06/12/2002    Years since quitting: 15.4  . Smokeless tobacco: Never Used  Substance and Sexual Activity  . Alcohol use: No  . Drug use: No  . Sexual activity: Not on file

## 2017-11-18 ENCOUNTER — Encounter (INDEPENDENT_AMBULATORY_CARE_PROVIDER_SITE_OTHER): Payer: Self-pay | Admitting: Orthopedic Surgery

## 2017-11-18 ENCOUNTER — Ambulatory Visit (INDEPENDENT_AMBULATORY_CARE_PROVIDER_SITE_OTHER): Payer: BLUE CROSS/BLUE SHIELD | Admitting: Orthopedic Surgery

## 2017-11-18 DIAGNOSIS — M7551 Bursitis of right shoulder: Secondary | ICD-10-CM | POA: Diagnosis not present

## 2017-11-18 NOTE — Progress Notes (Signed)
Office Visit Note   Patient: Allen Henry.           Date of Birth: 1955/10/24           MRN: [id number] Visit Date: 11/18/2017 Requested by: Marvis Repress, MD [id number] [place], [place] [address] PCP: Marvis Repress, MD  Subjective: Chief Complaint  Patient presents with  . Right Shoulder - Pain, Follow-up    HPI: Allen Henry is a patient with right shoulder pain.  Since he was last seen he has had an MRI scan of the right shoulder which is reviewed.  This shows mild bursal sided rotator cuff tearing which is not full-thickness.  He also has superior labral tearing.  No evidence of instability of the biceps anchor however on radiographic examination of the MRI scan.  He is having some continued shoulder pain but is not taking any medication.  He has had physical therapy for his back which helped him with his arms some because of the movement involved.  He sleeps on the right-hand side.  He has had 2 injections which have only given him marginal relief.              ROS: All systems reviewed are negative as they relate to the chief complaint within the history of present illness.  Patient denies  fevers or chills.   Assessment & Plan: Visit Diagnoses:  1. Bursitis of right shoulder     Plan: Impression is bursitis of the right shoulder.  I think surgically if he decides to proceed with intervention that would consist of biceps tenodesis and decompression.  I do not think he has a large enough rotator cuff tear to warrant further intervention in terms of rotator cuff repair.  He wants to consider his options and not jump into surgery right away.  I think formal shoulder directed physical therapy could be beneficial to him based on the on again off again nature of his symptoms.  That is prescribed and I will see him back as needed.  Follow-Up Instructions: Return if symptoms worsen or fail to improve.   Orders:  No orders of the defined types were placed in this  encounter.  No orders of the defined types were placed in this encounter.     Procedures: No procedures performed   Clinical Data: No additional findings.  Objective: Vital Signs: There were no vitals taken for this visit.  Physical Exam:   Constitutional: Patient appears well-developed HEENT:  Head: Normocephalic Eyes:EOM are normal Neck: Normal range of motion Cardiovascular: Normal rate Pulmonary/chest: Effort normal Neurologic: Patient is alert Skin: Skin is warm Psychiatric: Patient has normal mood and affect    Ortho Exam: Orthopedic exam demonstrates good range of motion of his cervical spine.  Shoulder range of motion also intact with no restriction of external rotation at 15 degrees of abduction.  Radial pulses intact bilaterally.  No other masses lymph adenopathy or skin changes noted in the shoulder girdle region.  He has good rotator cuff strength isolated infraspinatus supraspinatus and subscap muscle testing.  Specialty Comments:  No specialty comments available.  Imaging: No results found.   PMFS History: Patient Active Problem List   Diagnosis Date Noted  . Spinal stenosis of lumbar region 06/19/2017    Class: Chronic  . Status post lumbar laminectomy 06/19/2017   Past Medical History:  Diagnosis Date  . Arthritis   . Headache   . Pneumonia   . Vertigo     Family History  Problem Relation Age of Onset  . Heart attack Father   . Cancer Father   . Cancer Mother     Past Surgical History:  Procedure Laterality Date  . COLONOSCOPY    . LUMBAR LAMINECTOMY N/A 06/19/2017   Procedure: RIGHT LUMBAR THREE-FOUE AND LUMBAR FIVE-SACRAL ONE  PARTIAL HEMILAMINECTOMY, BILATERAL PARTIAL HEMILAMINECTOMY LUMBAR FOUR-FIVE;  Surgeon: Kerrin ChampagneNitka, James E, MD;  Location: [facility];  Service: Orthopedics;  Laterality: N/A;  RIGHT Lumbar three-four AND Lumbar five-Sacrum one  PARTIAL HEMILAMINECTOMY, BILATERAL PARTIAL HEMILAMINECTOMY Lumbar four-five   . TONSILLECTOMY      Social History   Occupational History  . Not on file  Tobacco Use  . Smoking status: Former Smoker    Types: Cigarettes    Last attempt to quit: 06/12/2002    Years since quitting: 15.4  . Smokeless tobacco: Never Used  Substance and Sexual Activity  . Alcohol use: No  . Drug use: No  . Sexual activity: Not on file

## 2017-11-21 ENCOUNTER — Other Ambulatory Visit (INDEPENDENT_AMBULATORY_CARE_PROVIDER_SITE_OTHER): Payer: Self-pay | Admitting: Specialist

## 2017-11-21 NOTE — Telephone Encounter (Signed)
Gabapentin Refill Request 

## 2017-12-04 ENCOUNTER — Other Ambulatory Visit (INDEPENDENT_AMBULATORY_CARE_PROVIDER_SITE_OTHER): Payer: Self-pay | Admitting: Specialist

## 2017-12-04 NOTE — Telephone Encounter (Signed)
Methocarbamol refill request 

## 2017-12-09 ENCOUNTER — Encounter (INDEPENDENT_AMBULATORY_CARE_PROVIDER_SITE_OTHER): Payer: Self-pay | Admitting: Specialist

## 2017-12-09 ENCOUNTER — Ambulatory Visit (INDEPENDENT_AMBULATORY_CARE_PROVIDER_SITE_OTHER): Payer: BLUE CROSS/BLUE SHIELD | Admitting: Specialist

## 2017-12-09 VITALS — BP 135/81 | HR 91 | Ht 71.0 in | Wt 300.0 lb

## 2017-12-09 DIAGNOSIS — M47816 Spondylosis without myelopathy or radiculopathy, lumbar region: Secondary | ICD-10-CM | POA: Diagnosis not present

## 2017-12-09 MED ORDER — GABAPENTIN 300 MG PO CAPS: 300.0000 mg | ORAL_CAPSULE | Freq: Two times a day (BID) | ORAL | 3 refills | Status: DC

## 2017-12-09 NOTE — Progress Notes (Signed)
Office Visit Note   Patient: Allen Henry.           Date of Birth: 1956/09/21           MRN: [id number] Visit Date: 12/09/2017              Requested by: Marvis Repress, MD [address] [place], [place] [address] PCP: Marvis Repress, MD   Assessment & Plan: Visit Diagnoses:  1. Spondylosis without myelopathy or radiculopathy, lumbar region     Plan: Avoid frequent bending and stooping  No lifting greater than 10-15 lbs. May use ice or moist heat for pain. Weight loss is of benefit. Meloxicam for muscle and joint aches and pain. Methocarbamol. For muscle spasm Gabapentin 300mg  one tablet  Twice daily.    Follow-Up Instructions: No Follow-up on file.   Orders:  No orders of the defined types were placed in this encounter.  Meds ordered this encounter  Medications  . gabapentin (NEURONTIN) 300 MG capsule    Sig: Take 1 capsule (300 mg total) by mouth 2 (two) times daily.    Dispense:  180 capsule    Refill:  3      Procedures: No procedures performed   Clinical Data: No additional findings.   Subjective: Chief Complaint  Patient presents with  . Lower Back - Follow-up    62 year old male with history of lumbar decompressive laminectomy bilateral L4-5, right L3-4 and right L5-S1. He feels not as painful today. He has not been able to return to his previous job. 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 Reports that he has tried to go on diability twice previously and was denied.      Review of Systems  Constitutional: Negative.   HENT: Negative.   Eyes: Negative.   Respiratory: Negative.   Cardiovascular: Negative.   Gastrointestinal: Negative.   Endocrine: Negative.   Genitourinary: Negative.   Musculoskeletal: Negative.   Skin: Negative.   Allergic/Immunologic: Negative.   Neurological: Negative.   Hematological: Negative.   Psychiatric/Behavioral: Negative.      Objective: Vital Signs: BP 135/81 (BP Location: Left  Arm, Patient Position: Sitting)   Pulse 91   Ht 5\' 11"  (1.803 m)   Wt 300 lb (136.1 kg)   BMI 41.84 kg/m   Physical Exam  Constitutional: He is oriented to person, place, and time. He appears well-developed and well-nourished.  HENT:  Head: Normocephalic and atraumatic.  Eyes: EOM are normal. Pupils are equal, round, and reactive to light.  Neck: Normal range of motion. Neck supple.  Pulmonary/Chest: Effort normal and breath sounds normal.  Abdominal: Soft. Bowel sounds are normal.  Neurological: He is alert and oriented to person, place, and time.  Skin: Skin is warm and dry.  Psychiatric: He has a normal mood and affect. His behavior is normal. Judgment and thought content normal.    Back Exam   Tenderness  The patient is experiencing tenderness in the lumbar.  Range of Motion  Extension: abnormal  Flexion: normal  Lateral bend right: abnormal  Lateral bend left: abnormal  Rotation right: abnormal  Rotation left: abnormal   Muscle Strength  Right Quadriceps:  5/5  Left Quadriceps:  5/5  Right Hamstrings:  5/5  Left Hamstrings:  5/5   Tests  Straight leg raise right: negative Straight leg raise left: negative  Reflexes  Patellar: abnormal Achilles: abnormal Babinski's sign: normal   Other  Toe walk: normal Heel walk: normal Sensation: normal Gait:  normal  Erythema: no back redness Scars: absent      Specialty Comments:  No specialty comments available.  Imaging: No results found.   PMFS History: Patient Active Problem List   Diagnosis Date Noted  . Spinal stenosis of lumbar region 06/19/2017    Priority: High    Class: Chronic  . Status post lumbar laminectomy 06/19/2017   Past Medical History:  Diagnosis Date  . Arthritis   . Headache   . Pneumonia   . Vertigo     Family History  Problem Relation Age of Onset  . Heart attack Father   . Cancer Father   . Cancer Mother     Past Surgical History:  Procedure Laterality Date  .  COLONOSCOPY    . LUMBAR LAMINECTOMY N/A 06/19/2017   Procedure: RIGHT LUMBAR THREE-FOUE AND LUMBAR FIVE-SACRAL ONE  PARTIAL HEMILAMINECTOMY, BILATERAL PARTIAL HEMILAMINECTOMY LUMBAR FOUR-FIVE;  Surgeon: Kerrin ChampagneNitka, Rosealee Recinos E, MD;  Location: [facility] OR;  Service: Orthopedics;  Laterality: N/A;  RIGHT Lumbar three-four AND Lumbar five-Sacrum one  PARTIAL HEMILAMINECTOMY, BILATERAL PARTIAL HEMILAMINECTOMY Lumbar four-five   . TONSILLECTOMY     Social History   Occupational History  . Not on file  Tobacco Use  . Smoking status: Former Smoker    Types: Cigarettes    Last attempt to quit: 06/12/2002    Years since quitting: 15.5  . Smokeless tobacco: Never Used  Substance and Sexual Activity  . Alcohol use: No  . Drug use: No  . Sexual activity: Not on file

## 2017-12-09 NOTE — Patient Instructions (Signed)
Avoid frequent bending and stooping  No lifting greater than 10-15 lbs. May use ice or moist heat for pain. Weight loss is of benefit. Meloxicam for muscle and joint aches and pain. Methocarbamol. For muscle spasm Gabapentin 300mg  one tablet  Twice daily.

## 2018-02-05 ENCOUNTER — Other Ambulatory Visit (INDEPENDENT_AMBULATORY_CARE_PROVIDER_SITE_OTHER): Payer: Self-pay | Admitting: Specialist

## 2018-02-05 NOTE — Telephone Encounter (Signed)
Patient called requesting an RX refill on his Methocarbamol.  He stated that he has about 7 pills left.  CB#[phone].  Thank you.

## 2018-02-06 MED ORDER — METHOCARBAMOL 500 MG PO TABS: 500.0000 mg | ORAL_TABLET | Freq: Three times a day (TID) | ORAL | 0 refills | Status: DC

## 2018-02-27 ENCOUNTER — Other Ambulatory Visit (INDEPENDENT_AMBULATORY_CARE_PROVIDER_SITE_OTHER): Payer: Self-pay | Admitting: Specialist

## 2018-02-27 NOTE — Telephone Encounter (Signed)
DICLOFENAC REILL REQUEST

## 2018-02-27 NOTE — Telephone Encounter (Signed)
DICLOFENAC REILL REQUEST 

## 2018-03-18 ENCOUNTER — Other Ambulatory Visit (INDEPENDENT_AMBULATORY_CARE_PROVIDER_SITE_OTHER): Payer: Self-pay | Admitting: Specialist

## 2018-03-18 NOTE — Telephone Encounter (Signed)
Methocarbamol refill request 

## 2018-04-18 ENCOUNTER — Other Ambulatory Visit (INDEPENDENT_AMBULATORY_CARE_PROVIDER_SITE_OTHER): Payer: Self-pay | Admitting: Specialist

## 2018-04-21 NOTE — Telephone Encounter (Signed)
methocarbamol refill request 

## 2018-05-26 ENCOUNTER — Other Ambulatory Visit (INDEPENDENT_AMBULATORY_CARE_PROVIDER_SITE_OTHER): Payer: Self-pay | Admitting: Specialist

## 2018-05-26 DIAGNOSIS — M4726 Other spondylosis with radiculopathy, lumbar region: Secondary | ICD-10-CM

## 2018-05-26 DIAGNOSIS — M48062 Spinal stenosis, lumbar region with neurogenic claudication: Secondary | ICD-10-CM

## 2018-05-26 NOTE — Telephone Encounter (Signed)
Do not take oxaprozin and diclofenac together as they can have a combined effect on the kidney, I will only renew the oxaprozin.

## 2018-05-26 NOTE — Telephone Encounter (Signed)
Daypro and Diclofenac refill request

## 2018-06-11 ENCOUNTER — Encounter (INDEPENDENT_AMBULATORY_CARE_PROVIDER_SITE_OTHER): Payer: Self-pay | Admitting: Specialist

## 2018-06-11 ENCOUNTER — Other Ambulatory Visit (INDEPENDENT_AMBULATORY_CARE_PROVIDER_SITE_OTHER): Payer: Self-pay | Admitting: Specialist

## 2018-06-11 ENCOUNTER — Ambulatory Visit (INDEPENDENT_AMBULATORY_CARE_PROVIDER_SITE_OTHER): Payer: BLUE CROSS/BLUE SHIELD | Admitting: Specialist

## 2018-06-11 ENCOUNTER — Ambulatory Visit (INDEPENDENT_AMBULATORY_CARE_PROVIDER_SITE_OTHER): Payer: Self-pay

## 2018-06-11 VITALS — BP 137/85 | HR 72 | Ht 71.0 in | Wt 300.0 lb

## 2018-06-11 DIAGNOSIS — M5442 Lumbago with sciatica, left side: Secondary | ICD-10-CM

## 2018-06-11 DIAGNOSIS — M47816 Spondylosis without myelopathy or radiculopathy, lumbar region: Secondary | ICD-10-CM

## 2018-06-11 DIAGNOSIS — G8929 Other chronic pain: Secondary | ICD-10-CM

## 2018-06-11 NOTE — Progress Notes (Signed)
Office Visit Note   Patient: Allen Henry.           Date of Birth: December 23, 1955           MRN: [id number] Visit Date: 06/11/2018              Requested by: Marvis Repress, MD [address] [place], [place] [address] PCP: Marvis Repress, MD   Assessment & Plan: Visit Diagnoses:  1. Chronic bilateral low back pain with left-sided sciatica   2. Spondylosis without myelopathy or radiculopathy, lumbar region     Plan:Avoid bending, stooping and avoid lifting weights greater than 10 lbs. Avoid prolong standing and walking. Avoid frequent bending and stooping  No lifting greater than 10 lbs. May use ice or moist heat for pain. Weight loss is of benefit. Handicap license is approved. Dr. [place] Blas secretary/Assistant will call to arrange for lumbar facet block injection. Take meloxicam 15 mg po q day. May take methocarbamal. Gabapentin is for nerve pain.  Follow-Up Instructions: No follow-ups on file.   Orders:  Orders Placed This Encounter  Procedures  . XR Lumbar Spine Complete W/Bend   No orders of the defined types were placed in this encounter.     Procedures: No procedures performed   Clinical Data: No additional findings.   Subjective: Chief Complaint  Patient presents with  . Lower Back - Follow-up    62 year old male with history of back pain with radiation into the right leg. Pain is now occurring into the lower back at the belt line. It happened twice in the past month. Underwent lumbar decompressive laminectomy right L3-4, L4-5 and L5-S1. He is currently not employed and is performing the chores at home. No leg numbness or paresthesias but has back pain, pain that comes and goes depending on the level of work at home. 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   Review of  Systems  Constitutional: Negative.   HENT: Negative.   Eyes: Negative.   Respiratory: Negative.   Cardiovascular: Negative.   Gastrointestinal: Negative.   Endocrine: Negative.   Genitourinary: Negative.   Musculoskeletal: Negative.   Skin: Negative.   Allergic/Immunologic: Negative.   Neurological: Negative.   Hematological: Negative.   Psychiatric/Behavioral: Negative.      Objective: Vital Signs: BP 137/85 (BP Location: Left Arm, Patient Position: Sitting)   Pulse 72   Ht 5\' 11"  (1.803 m)   Wt 300 lb (136.1 kg)   BMI 41.84 kg/m   Physical Exam  Constitutional: He is oriented to person, place, and time. He appears well-developed and well-nourished.  HENT:  Head: Normocephalic and atraumatic.  Eyes: Pupils are equal, round, and reactive to light. EOM are normal.  Neck: Normal range of motion. Neck supple.  Pulmonary/Chest: Effort normal and breath sounds normal.  Abdominal: Soft. Bowel sounds are normal.  Neurological: He is alert and oriented to person, place, and time.  Skin: Skin is warm and dry.  Psychiatric: He has a normal mood and affect. His behavior is normal. Judgment and thought content normal.    Back Exam   Tenderness  The patient is experiencing tenderness in the lumbar.  Range of Motion  Extension: abnormal  Flexion: abnormal  Lateral bend right: normal  Lateral bend left: normal  Rotation right: normal  Rotation left: normal  Muscle Strength  Right Quadriceps:  5/5  Left Quadriceps:  5/5  Right Hamstrings:  5/5  Left Hamstrings:  5/5   Tests  Straight leg raise right: negative Straight leg raise left: negative  Reflexes  Patellar: Hyporeflexic Achilles: Hyporeflexic Babinski's sign: normal   Other  Toe walk: normal Heel walk: normal Sensation: normal Gait: normal  Erythema: no back redness Scars: absent      Specialty Comments:  No specialty comments available.  Imaging: Xr Lumbar Spine Complete W/bend  Result  Date: 06/11/2018 AP and lateral flexion and extension radiographs of the lumbar spine show a grade 1 anterolisthesis L4-5. This is at the same level as L4-5 laminectomy from one year ago. Plain radiograph at the time of surgery does show minimal anterolisthesis todays radiographs show this to be slightly worse spondylolysis.Marland Kitchen    PMFS History: Patient Active Problem List   Diagnosis Date Noted  . Spinal stenosis of lumbar region 06/19/2017    Priority: High    Class: Chronic  . Status post lumbar laminectomy 06/19/2017   Past Medical History:  Diagnosis Date  . Arthritis   . Headache   . Pneumonia   . Vertigo     Family History  Problem Relation Age of Onset  . Heart attack Father   . Cancer Father   . Cancer Mother     Past Surgical History:  Procedure Laterality Date  . COLONOSCOPY    . LUMBAR LAMINECTOMY N/A 06/19/2017   Procedure: RIGHT LUMBAR THREE-FOUE AND LUMBAR FIVE-SACRAL ONE  PARTIAL HEMILAMINECTOMY, BILATERAL PARTIAL HEMILAMINECTOMY LUMBAR FOUR-FIVE;  Surgeon: Kerrin Champagne, MD;  Location: [facility] OR;  Service: Orthopedics;  Laterality: N/A;  RIGHT Lumbar three-four AND Lumbar five-Sacrum one  PARTIAL HEMILAMINECTOMY, BILATERAL PARTIAL HEMILAMINECTOMY Lumbar four-five   . TONSILLECTOMY     Social History   Occupational History  . Not on file  Tobacco Use  . Smoking status: Former Smoker    Types: Cigarettes    Last attempt to quit: 06/12/2002    Years since quitting: 16.0  . Smokeless tobacco: Never Used  Substance and Sexual Activity  . Alcohol use: No  . Drug use: No    Types: PCP  . Sexual activity: Not on file

## 2018-06-11 NOTE — Patient Instructions (Signed)
Avoid bending, stooping and avoid lifting weights greater than 10 lbs. Avoid prolong standing and walking. Avoid frequent bending and stooping  No lifting greater than 10 lbs. May use ice or moist heat for pain. Weight loss is of benefit. Handicap license is approved. Dr. [place] Blas secretary/Assistant will call to arrange for lumbar facet block injection. Take meloxicam 15 mg po q day. May take methocarbamal. Gabapentin is for nerve pain.

## 2018-06-11 NOTE — Telephone Encounter (Signed)
Methocarbamol refill request 

## 2018-06-24 ENCOUNTER — Ambulatory Visit (INDEPENDENT_AMBULATORY_CARE_PROVIDER_SITE_OTHER): Payer: Self-pay

## 2018-06-24 ENCOUNTER — Ambulatory Visit (INDEPENDENT_AMBULATORY_CARE_PROVIDER_SITE_OTHER): Payer: BLUE CROSS/BLUE SHIELD | Admitting: Physical Medicine and Rehabilitation

## 2018-06-24 ENCOUNTER — Encounter (INDEPENDENT_AMBULATORY_CARE_PROVIDER_SITE_OTHER): Payer: Self-pay | Admitting: Physical Medicine and Rehabilitation

## 2018-06-24 VITALS — BP 130/70 | HR 77

## 2018-06-24 DIAGNOSIS — M47816 Spondylosis without myelopathy or radiculopathy, lumbar region: Secondary | ICD-10-CM

## 2018-06-24 MED ORDER — METHYLPREDNISOLONE ACETATE 80 MG/ML IJ SUSP
80.0000 mg | Freq: Once | INTRAMUSCULAR | Status: AC
  Administered 2018-06-24: 80 mg

## 2018-06-24 NOTE — Progress Notes (Signed)
 .  Numeric Pain Rating Scale and Functional Assessment Average Pain 8   In the last MONTH (on 0-10 scale) has pain interfered with the following?  1. General activity like being  able to carry out your everyday physical activities such as walking, climbing stairs, carrying groceries, or moving a chair?  Rating(4)   +Driver, -BT, -Dye Allergies.  

## 2018-06-24 NOTE — Patient Instructions (Signed)

## 2018-07-04 NOTE — Procedures (Signed)
Allen Henry is a 62 year old gentleman with chronic worsening axial right-sided low back pain.  He is followed by Dr. Vira Browns for his orthopedic spine.  Dr. Otelia Sergeant request diagnostic medial branch blocks of the right L4-5 and L5-S1 facet joints.  We will complete this with pain diary.  Patient has pain with facet joint loading which is concordant with his back pain.  Imaging consistent as well.  No radicular right-sided leg pain. Lumbar Diagnostic Facet Joint Nerve Block with Fluoroscopic Guidance   Patient: Allen Henry.      Date of Birth: 01-06-1956 MRN: [id number] PCP: Marvis Repress, MD      Visit Date: 06/24/2018   Universal Protocol:    Date/Time: 09/27/196:27 AM  Consent Given By: the patient  Position: PRONE  Additional Comments: Vital signs were monitored before and after the procedure. Patient was prepped and draped in the usual sterile fashion. The correct patient, procedure, and site was verified.   Injection Procedure Details:  Procedure Site One Meds Administered:  Meds ordered this encounter  Medications  . methylPREDNISolone acetate (DEPO-MEDROL) injection 80 mg     Laterality: Right  Location/Site:  L4-L5 L5-S1  Needle size: 22 ga.  Needle type:spinal  Needle Placement: Oblique pedical  Findings:   -Comments: There was excellent flow of contrast along the articular pillars without intravascular flow.  Procedure Details: The fluoroscope beam is vertically oriented in AP and then obliqued 15 to 20 degrees to the ipsilateral side of the desired nerve to achieve the "Scotty dog" appearance.  The skin over the target area of the junction of the superior articulating process and the transverse process (sacral ala if blocking the L5 dorsal rami) was locally anesthetized with a 1 ml volume of 1% Lidocaine without Epinephrine.  The spinal needle was inserted and advanced in a trajectory view down to the target.   After contact with  periosteum and negative aspirate for blood and CSF, correct placement without intravascular or epidural spread was confirmed by injecting 0.5 ml. of Isovue-250.  A spot radiograph was obtained of this image.    Next, a 0.5 ml. volume of the injectate described above was injected. The needle was then redirected to the other facet joint nerves mentioned above if needed.  Prior to the procedure, the patient was given a Pain Diary which was completed for baseline measurements.  After the procedure, the patient rated their pain every 30 minutes and will continue rating at this frequency for a total of 5 hours.  The patient has been asked to complete the Diary and return to [place] by mail, fax or hand delivered as soon as possible.   Additional Comments:  The patient tolerated the procedure well Dressing: Band-Aid    Post-procedure details: Patient was observed during the procedure. Post-procedure instructions were reviewed.  Patient left the clinic in stable condition.  Findings:  No additional findings.

## 2018-07-21 ENCOUNTER — Encounter (INDEPENDENT_AMBULATORY_CARE_PROVIDER_SITE_OTHER): Payer: Self-pay | Admitting: Specialist

## 2018-07-21 ENCOUNTER — Ambulatory Visit (INDEPENDENT_AMBULATORY_CARE_PROVIDER_SITE_OTHER): Payer: BLUE CROSS/BLUE SHIELD | Admitting: Specialist

## 2018-07-21 VITALS — BP 144/82 | HR 78 | Ht 71.0 in | Wt 300.0 lb

## 2018-07-21 DIAGNOSIS — M47816 Spondylosis without myelopathy or radiculopathy, lumbar region: Secondary | ICD-10-CM | POA: Diagnosis not present

## 2018-07-21 NOTE — Patient Instructions (Signed)
Avoid bending, stooping and avoid lifting weights greater than 10 lbs. Avoid prolong standing and walking. Avoid frequent bending and stooping  No lifting greater than 10 lbs. May use ice or moist heat for pain. Weight loss is of benefit. Handicap license is approved. Dr. [place] Blas secretary/Assistant will call to arrange for further facet blocks to determine if radio frequency ablation of the facet medial branch nerve is able to be considered

## 2018-07-21 NOTE — Progress Notes (Signed)
Office Visit Note   Patient: Allen Henry.           Date of Birth: 02-14-1956           MRN: [id number] Visit Date: 07/21/2018              Requested by: Marvis Repress, MD [address] [place], [place] [address] PCP: Marvis Repress, MD   Assessment & Plan: Visit Diagnoses:  1. Spondylosis without myelopathy or radiculopathy, lumbar region     Plan: Avoid bending, stooping and avoid lifting weights greater than 10 lbs. Avoid prolong standing and walking. Avoid frequent bending and stooping  No lifting greater than 10 lbs. May use ice or moist heat for pain. Weight loss is of benefit. Handicap license is approved. Dr. [place] Blas secretary/Assistant will call to arrange for further facet blocks to determine if radio frequency ablation of the facet medial branch nerve is able to be considered  Follow-Up Instructions: Return in about 6 months (around 01/20/2019).   Orders:  No orders of the defined types were placed in this encounter.  No orders of the defined types were placed in this encounter.     Procedures: No procedures performed   Clinical Data: No additional findings.   Subjective: Chief Complaint  Patient presents with  . Lower Back - Follow-up    He had Right L4-5 and L5-S1 Facet on 06/24/18. He states that he did get relief for awhile, noticed pain returning about 2 weeks ago. He is scheduled for repeat procedure on 07/24/18.    62 year old male with history of lumbar spondylosis and spinal stenosis, he is status post lateral recess decompression 13 months ago with pain now that is primarily back with some radiation into the right thigh and leg.He has pain with  Standing and walking. Had about 75 % relief with the facet blocks done 9/17 and he is scheduled for a second series of blocks in his evaluation for possible RFA is he is found to have significant relief.   Review of Systems  Constitutional: Negative.  Negative for activity  change, appetite change, chills, diaphoresis, fatigue, fever and unexpected weight change.  HENT: Negative.   Eyes: Negative.   Respiratory: Negative.   Cardiovascular: Negative.   Gastrointestinal: Negative.   Endocrine: Negative.   Genitourinary: Negative.   Musculoskeletal: Negative.   Skin: Negative.   Allergic/Immunologic: Negative.   Neurological: Negative.   Hematological: Negative.   Psychiatric/Behavioral: Negative.      Objective: Vital Signs: BP (!) 144/82 (BP Location: Left Arm, Patient Position: Sitting)   Pulse 78   Ht 5\' 11"  (1.803 m)   Wt 300 lb (136.1 kg)   BMI 41.84 kg/m   Physical Exam  Constitutional: He is oriented to person, place, and time. He appears well-developed and well-nourished.  HENT:  Head: Normocephalic and atraumatic.  Eyes: Pupils are equal, round, and reactive to light. EOM are normal.  Neck: Normal range of motion. Neck supple.  Pulmonary/Chest: Effort normal and breath sounds normal.  Abdominal: Soft. Bowel sounds are normal.  Neurological: He is alert and oriented to person, place, and time.  Skin: Skin is warm and dry.  Psychiatric: He has a normal mood and affect. His behavior is normal. Judgment and thought content normal.    Back Exam   Tenderness  The patient is experiencing tenderness in the lumbar.  Range of Motion  Extension: normal  Flexion: abnormal  Lateral bend right: normal  Lateral bend left: normal  Rotation right: normal  Rotation left: normal   Muscle Strength  Right Quadriceps:  5/5  Left Quadriceps:  5/5  Right Hamstrings:  5/5   Tests  Straight leg raise right: negative Straight leg raise left: negative  Reflexes  Patellar:  1/4 normal Achilles: 1/4 Babinski's sign: normal   Other  Heel walk: normal Sensation: normal Gait: normal  Erythema: no back redness Scars: present  Comments:  Extension with pain       Specialty Comments:  No specialty comments available.  Imaging: No  results found.   PMFS History: Patient Active Problem List   Diagnosis Date Noted  . Spinal stenosis of lumbar region 06/19/2017    Priority: High    Class: Chronic  . Status post lumbar laminectomy 06/19/2017   Past Medical History:  Diagnosis Date  . Arthritis   . Headache   . Pneumonia   . Vertigo     Family History  Problem Relation Age of Onset  . Heart attack Father   . Cancer Father   . Cancer Mother     Past Surgical History:  Procedure Laterality Date  . COLONOSCOPY    . LUMBAR LAMINECTOMY N/A 06/19/2017   Procedure: RIGHT LUMBAR THREE-FOUE AND LUMBAR FIVE-SACRAL ONE  PARTIAL HEMILAMINECTOMY, BILATERAL PARTIAL HEMILAMINECTOMY LUMBAR FOUR-FIVE;  Surgeon: Kerrin Champagne, MD;  Location: [facility] OR;  Service: Orthopedics;  Laterality: N/A;  RIGHT Lumbar three-four AND Lumbar five-Sacrum one  PARTIAL HEMILAMINECTOMY, BILATERAL PARTIAL HEMILAMINECTOMY Lumbar four-five   . TONSILLECTOMY     Social History   Occupational History  . Not on file  Tobacco Use  . Smoking status: Former Smoker    Types: Cigarettes    Last attempt to quit: 06/12/2002    Years since quitting: 16.1  . Smokeless tobacco: Never Used  Substance and Sexual Activity  . Alcohol use: No  . Drug use: No    Types: PCP  . Sexual activity: Not on file

## 2018-07-24 ENCOUNTER — Encounter (INDEPENDENT_AMBULATORY_CARE_PROVIDER_SITE_OTHER): Payer: Self-pay | Admitting: Physical Medicine and Rehabilitation

## 2018-07-24 ENCOUNTER — Ambulatory Visit (INDEPENDENT_AMBULATORY_CARE_PROVIDER_SITE_OTHER): Payer: BLUE CROSS/BLUE SHIELD | Admitting: Physical Medicine and Rehabilitation

## 2018-07-24 ENCOUNTER — Ambulatory Visit (INDEPENDENT_AMBULATORY_CARE_PROVIDER_SITE_OTHER): Payer: Self-pay

## 2018-07-24 VITALS — BP 135/83 | HR 85 | Temp 98.1°F

## 2018-07-24 DIAGNOSIS — M47816 Spondylosis without myelopathy or radiculopathy, lumbar region: Secondary | ICD-10-CM

## 2018-07-24 MED ORDER — BUPIVACAINE HCL 0.5 % IJ SOLN
3.0000 mL | Freq: Once | INTRAMUSCULAR | Status: AC
  Administered 2018-07-24: 3 mL

## 2018-07-24 MED ORDER — METHYLPREDNISOLONE ACETATE 80 MG/ML IJ SUSP
80.0000 mg | Freq: Once | INTRAMUSCULAR | Status: AC
  Administered 2018-07-24: 80 mg

## 2018-07-24 NOTE — Progress Notes (Signed)
 .  Numeric Pain Rating Scale and Functional Assessment Average Pain 8   In the last MONTH (on 0-10 scale) has pain interfered with the following?  1. General activity like being  able to carry out your everyday physical activities such as walking, climbing stairs, carrying groceries, or moving a chair?  Rating(5)   +Driver, -BT, -Dye Allergies.  

## 2018-07-24 NOTE — Patient Instructions (Signed)

## 2018-07-28 ENCOUNTER — Encounter (INDEPENDENT_AMBULATORY_CARE_PROVIDER_SITE_OTHER): Payer: Self-pay | Admitting: Physical Medicine and Rehabilitation

## 2018-08-05 NOTE — Progress Notes (Signed)
Allen Henry. - 62 y.o. male MRN [id number]  Date of birth: 1956-01-06  Office Visit Note: Visit Date: 07/24/2018 PCP: Marvis Repress, MD Referred by: Marvis Repress, MD  Subjective: Chief Complaint  Patient presents with  . Lower Back - Pain  . Right Thigh - Pain   HPI:  Allen Henry. is a 62 y.o. male who comes in today For planned second diagnostic medial branch block of the L4-5 L5-S1 facets on the right.  He reported 75% relief with prior facet joint medial branch block performed in September.  He is followed by Dr. Otelia Sergeant his notes can be reviewed.  He does have pain with extension and facet joint loading concordant with his low back pain on the right.  He has had physical therapy and medication management and is been ongoing for a long time.  MRI imaging does not show significant stenosis or nerve compression.  Patient does not have radicular pain.  We will complete this injection as a double block paradigm and if he does get good relief we will look at radiofrequency ablation of the same joints for more longer-term relief.  ROS Otherwise per HPI.  Assessment & Plan: Visit Diagnoses:  1. Spondylosis without myelopathy or radiculopathy, lumbar region     Plan: No additional findings.   Meds & Orders:  Meds ordered this encounter  Medications  . bupivacaine (MARCAINE) 0.5 % (with pres) injection 3 mL  . methylPREDNISolone acetate (DEPO-MEDROL) injection 80 mg    Orders Placed This Encounter  Procedures  . Facet Injection  . XR C-ARM NO REPORT    Follow-up: Return for Review Pain Diary.   Procedures: No procedures performed  Lumbar Diagnostic Facet Joint Nerve Block with Fluoroscopic Guidance   Patient: Allen Henry.      Date of Birth: 1956-09-11 MRN: [id number] PCP: Marvis Repress, MD      Visit Date: 07/24/2018   Universal Protocol:    Date/Time: 10/29/195:30 AM  Consent Given By: the patient  Position:  PRONE  Additional Comments: Vital signs were monitored before and after the procedure. Patient was prepped and draped in the usual sterile fashion. The correct patient, procedure, and site was verified.   Injection Procedure Details:  Procedure Site One Meds Administered:  Meds ordered this encounter  Medications  . bupivacaine (MARCAINE) 0.5 % (with pres) injection 3 mL  . methylPREDNISolone acetate (DEPO-MEDROL) injection 80 mg     Laterality: Right  Location/Site:  L4-L5 L5-S1  Needle size: 22 ga.  Needle type:spinal  Needle Placement: Oblique pedical  Findings:   -Comments: There was excellent flow of contrast along the articular pillars without intravascular flow.  Procedure Details: The fluoroscope beam is vertically oriented in AP and then obliqued 15 to 20 degrees to the ipsilateral side of the desired nerve to achieve the "Scotty dog" appearance.  The skin over the target area of the junction of the superior articulating process and the transverse process (sacral ala if blocking the L5 dorsal rami) was locally anesthetized with a 1 ml volume of 1% Lidocaine without Epinephrine.  The spinal needle was inserted and advanced in a trajectory view down to the target.   After contact with periosteum and negative aspirate for blood and CSF, correct placement without intravascular or epidural spread was confirmed by injecting 0.5 ml. of Isovue-250.  A spot radiograph was obtained of this image.    Next, a 0.5 ml. volume of the injectate described above was injected.  The needle was then redirected to the other facet joint nerves mentioned above if needed.  Prior to the procedure, the patient was given a Pain Diary which was completed for baseline measurements.  After the procedure, the patient rated their pain every 30 minutes and will continue rating at this frequency for a total of 5 hours.  The patient has been asked to complete the Diary and return to [place] by mail, fax or  hand delivered as soon as possible.   Additional Comments:  The patient tolerated the procedure well Dressing: Band-Aid    Post-procedure details: Patient was observed during the procedure. Post-procedure instructions were reviewed.  Patient left the clinic in stable condition.   Clinical History: Acute Interface, Incoming Rad Results - 03/27/2017  9:41 AM EDT TECHNIQUE: Multiplanar, multisequence MR imaging of the lumbar spine was obtained without contrast  COMPARISON: 04/27/2015  INDICATION: Low back pain  FINDINGS:  . The conus medullaris appears normal, terminating at the L1 level. No abnormal signal demonstrated within the visualized distal spinal cord. Cauda equina unremarkable.  . Vertebral body heights maintained. Marrow signal within normal limits for age.  Alignment anatomic.  . The visualized lower thoracic spine demonstrates no disc herniation, significant central canal or foraminal stenosis.  . T12-L1: No disc herniation, significant central canal or foraminal stenosis. Marland Kitchen L1-L2: No disc herniation, significant central canal or foraminal stenosis. Marland Kitchen L2-L3: No disc herniation, significant central canal or foraminal stenosis. Marland Kitchen L3-L4: Moderate right foraminal stenosis secondary to broad-based disc protrusion and facet arthrosis. Marland Kitchen L4-L5: Severe right foraminal stenosis secondary to facet arthrosis and ligamentum flavum thickening as well as a broad-based disc osteophyte complex. There is also moderate left foraminal stenosis.. Moderate central canal stenosis . L5-S1: Severe right facet arthrosis. Low signal structure in the posterolateral aspect of the right neural foramen may represent extruded disc material or osteophyte with severe crowding of the right neural foramen.  . Soft tissues are grossly unremarkable   IMPRESSION: Severe right facet arthrosis at L5-S1. Low signal structure in the posterolateral aspect of the right neural foramen may represent extruded  disc material or osteophyte with severe crowding of the right neural foramen.. Correlate for right L5  radiculopathy.  Severe right and moderate left foraminal stenosis at L4-5. Moderate central canal stenosis at this level similar to the prior.  Moderate right foraminal stenosis at L3-4  Overall minimal change from the prior exam     Objective:  VS:  HT:    WT:   BMI:     BP:135/83  HR:85bpm  TEMP:98.1 F (36.7 C)(Oral)  RESP:  Physical Exam  Ortho Exam Imaging: No results found.

## 2018-08-05 NOTE — Procedures (Signed)
Lumbar Diagnostic Facet Joint Nerve Block with Fluoroscopic Guidance   Patient: Allen Henry.      Date of Birth: [birth year] MRN: [id number] PCP: Marvis Repress, MD      Visit Date: 07/24/2018   Universal Protocol:    Date/Time: 10/29/195:30 AM  Consent Given By: the patient  Position: PRONE  Additional Comments: Vital signs were monitored before and after the procedure. Patient was prepped and draped in the usual sterile fashion. The correct patient, procedure, and site was verified.   Injection Procedure Details:  Procedure Site One Meds Administered:  Meds ordered this encounter  Medications  . bupivacaine (MARCAINE) 0.5 % (with pres) injection 3 mL  . methylPREDNISolone acetate (DEPO-MEDROL) injection 80 mg     Laterality: Right  Location/Site:  L4-L5 L5-S1  Needle size: 22 ga.  Needle type:spinal  Needle Placement: Oblique pedical  Findings:   -Comments: There was excellent flow of contrast along the articular pillars without intravascular flow.  Procedure Details: The fluoroscope beam is vertically oriented in AP and then obliqued 15 to 20 degrees to the ipsilateral side of the desired nerve to achieve the "Scotty dog" appearance.  The skin over the target area of the junction of the superior articulating process and the transverse process (sacral ala if blocking the L5 dorsal rami) was locally anesthetized with a 1 ml volume of 1% Lidocaine without Epinephrine.  The spinal needle was inserted and advanced in a trajectory view down to the target.   After contact with periosteum and negative aspirate for blood and CSF, correct placement without intravascular or epidural spread was confirmed by injecting 0.5 ml. of Isovue-250.  A spot radiograph was obtained of this image.    Next, a 0.5 ml. volume of the injectate described above was injected. The needle was then redirected to the other facet joint nerves mentioned above if needed.  Prior to  the procedure, the patient was given a Pain Diary which was completed for baseline measurements.  After the procedure, the patient rated their pain every 30 minutes and will continue rating at this frequency for a total of 5 hours.  The patient has been asked to complete the Diary and return to [place] by mail, fax or hand delivered as soon as possible.   Additional Comments:  The patient tolerated the procedure well Dressing: Band-Aid    Post-procedure details: Patient was observed during the procedure. Post-procedure instructions were reviewed.  Patient left the clinic in stable condition.

## 2018-08-06 ENCOUNTER — Other Ambulatory Visit (INDEPENDENT_AMBULATORY_CARE_PROVIDER_SITE_OTHER): Payer: Self-pay | Admitting: Specialist

## 2018-08-07 NOTE — Telephone Encounter (Signed)
Methocarbamol refill request 

## 2018-08-07 NOTE — Telephone Encounter (Signed)
Okay to give Robaxin 500 mg 1 every 12 as needed spasms #40.  Must take as directed

## 2018-08-07 NOTE — Telephone Encounter (Signed)
Methocarbamol refill request, Please advise

## 2018-08-12 ENCOUNTER — Other Ambulatory Visit (INDEPENDENT_AMBULATORY_CARE_PROVIDER_SITE_OTHER): Payer: Self-pay | Admitting: Specialist

## 2018-08-13 ENCOUNTER — Telehealth (INDEPENDENT_AMBULATORY_CARE_PROVIDER_SITE_OTHER): Payer: Self-pay | Admitting: Physical Medicine and Rehabilitation

## 2018-08-15 NOTE — Telephone Encounter (Signed)
I think that was number two, so we can try to get RFA auth.

## 2018-08-15 NOTE — Telephone Encounter (Signed)
Submitted PA for RFA, case is pending.  

## 2018-08-19 ENCOUNTER — Telehealth (INDEPENDENT_AMBULATORY_CARE_PROVIDER_SITE_OTHER): Payer: Self-pay | Admitting: *Deleted

## 2018-08-19 NOTE — Telephone Encounter (Signed)
Received Approval for RFA  Reference # [id number] Effective dates 08/15/18-10/07/18  Called pt and lvm #1 to get pt scheduled for RFA.

## 2018-09-08 ENCOUNTER — Ambulatory Visit (INDEPENDENT_AMBULATORY_CARE_PROVIDER_SITE_OTHER): Payer: BLUE CROSS/BLUE SHIELD | Admitting: Physical Medicine and Rehabilitation

## 2018-09-08 ENCOUNTER — Encounter (INDEPENDENT_AMBULATORY_CARE_PROVIDER_SITE_OTHER): Payer: Self-pay | Admitting: Physical Medicine and Rehabilitation

## 2018-09-08 ENCOUNTER — Ambulatory Visit (INDEPENDENT_AMBULATORY_CARE_PROVIDER_SITE_OTHER): Payer: Self-pay

## 2018-09-08 VITALS — BP 141/96 | HR 89 | Temp 98.1°F

## 2018-09-08 DIAGNOSIS — M47816 Spondylosis without myelopathy or radiculopathy, lumbar region: Secondary | ICD-10-CM

## 2018-09-08 MED ORDER — METHYLPREDNISOLONE ACETATE 80 MG/ML IJ SUSP
40.0000 mg | Freq: Once | INTRAMUSCULAR | Status: AC
  Administered 2018-09-08: 40 mg

## 2018-09-08 NOTE — Patient Instructions (Signed)

## 2018-09-08 NOTE — Progress Notes (Signed)
 .  Numeric Pain Rating Scale and Functional Assessment Average Pain 6   In the last MONTH (on 0-10 scale) has pain interfered with the following?  1. General activity like being  able to carry out your everyday physical activities such as walking, climbing stairs, carrying groceries, or moving a chair?  Rating(3)   +Driver

## 2018-09-10 NOTE — Progress Notes (Signed)
Allen BrasilRaymond Kenneth Villalva Jr. - 62 y.o. male MRN [id number]  Date of birth: 31-Jul-1956  Office Visit Note: Visit Date: 09/08/2018 PCP: Marvis RepressSchaeffer, Stanley, MD Referred by: Marvis RepressSchaeffer, Stanley, MD  Subjective: Chief Complaint  Patient presents with  . Lower Back - Pain  . Right Leg - Pain   HPI:  Allen BrasilRaymond Kenneth Hunsberger Jr. is a 62 y.o. male who comes in today For planned radiofrequency ablation of the lumbar L4-5 and L5-S1 facet joints.  Patient has had double block paradigm medial branch blocks with pain diary showing good relief and this is documented.  He is failed conservative care with medication management and therapy and activity modification.  He has been followed closely by Dr. Vira BrownsJames Nitka from a spine surgery standpoint.  He does have concordant low back pain with facet joint loading and extension.  Exam today is nonfocal other than pain with extension of the lumbar spine with facet loading.  He has no radicular complaints or focal nerve compression.  MRI reviewed below.  ROS Otherwise per HPI.  Assessment & Plan: Visit Diagnoses:  1. Spondylosis without myelopathy or radiculopathy, lumbar region     Plan: No additional findings.   Meds & Orders:  Meds ordered this encounter  Medications  . methylPREDNISolone acetate (DEPO-MEDROL) injection 40 mg    Orders Placed This Encounter  Procedures  . Radiofrequency,Lumbar  . XR C-ARM NO REPORT    Follow-up: Return in about 4 weeks (around 10/06/2018), or if symptoms worsen or fail to improve.   Procedures: No procedures performed  Lumbar Facet Joint Nerve Denervation  Patient: Allen BrasilRaymond Kenneth Taffe Jr.      Date of Birth: 31-Jul-1956 MRN: [id number] PCP: Marvis RepressSchaeffer, Stanley, MD      Visit Date: 09/08/2018   Universal Protocol:    Date/Time: 12/04/196:16 AM  Consent Given By: the patient  Position: PRONE  Additional Comments: Vital signs were monitored before and after the procedure. Patient was prepped and draped in  the usual sterile fashion. The correct patient, procedure, and site was verified.   Injection Procedure Details:  Procedure Site One Meds Administered:  Meds ordered this encounter  Medications  . methylPREDNISolone acetate (DEPO-MEDROL) injection 40 mg     Laterality: Right  Location/Site:  L4-L5 L5-S1  Needle size: 18 G  Needle type: Radiofrequency cannula  Needle Placement: Along juncture of superior articular process and transverse pocess  Findings:  -Comments:  Procedure Details: For each desired target nerve, the corresponding transverse process (sacral ala for the L5 dorsal rami) was identified and the fluoroscope was positioned to square off the endplates of the corresponding vertebral body to achieve a true AP midline view.  The beam was then obliqued 15 to 20 degrees and caudally tilted 15 to 20 degrees to line up a trajectory along the target nerves. The skin over the target of the junction of superior articulating process and transverse process (sacral ala for the L5 dorsal rami) was infiltrated with 1ml of 1% Lidocaine without Epinephrine.  The 18 gauge 10mm active tip outer cannula was advanced in trajectory view to the target.  This procedure was repeated for each target nerve.  Then, for all levels, the outer cannula placement was fine-tuned and the position was then confirmed with bi-planar imaging.    Test stimulation was done both at sensory and motor levels to ensure there was no radicular stimulation. The target tissues were then infiltrated with 1 ml of 1% Lidocaine without Epinephrine. Subsequently, a percutaneous neurotomy was carried  out for 60 seconds at 80 degrees Celsius. The procedure was repeated with the cannula rotated 90 degrees, for duration of 60 seconds, one additional time at each level for a total of two lesions per level.  After the completion of the two lesions, 1 ml of injectate was delivered. It was then repeated for each facet joint nerve  mentioned above. Appropriate radiographs were obtained to verify the probe placement during the neurotomy.   Additional Comments:  The patient tolerated the procedure well Dressing: Band-Aid    Post-procedure details: Patient was observed during the procedure. Post-procedure instructions were reviewed.  Patient left the clinic in stable condition.      Clinical History: Acute Interface, Incoming Rad Results - 03/27/2017  9:41 AM EDT TECHNIQUE: Multiplanar, multisequence MR imaging of the lumbar spine was obtained without contrast  COMPARISON: 04/27/2015  INDICATION: Low back pain  FINDINGS:  . The conus medullaris appears normal, terminating at the L1 level. No abnormal signal demonstrated within the visualized distal spinal cord. Cauda equina unremarkable.  . Vertebral body heights maintained. Marrow signal within normal limits for age.  Alignment anatomic.  . The visualized lower thoracic spine demonstrates no disc herniation, significant central canal or foraminal stenosis.  . T12-L1: No disc herniation, significant central canal or foraminal stenosis. Marland Kitchen L1-L2: No disc herniation, significant central canal or foraminal stenosis. Marland Kitchen L2-L3: No disc herniation, significant central canal or foraminal stenosis. Marland Kitchen L3-L4: Moderate right foraminal stenosis secondary to broad-based disc protrusion and facet arthrosis. Marland Kitchen L4-L5: Severe right foraminal stenosis secondary to facet arthrosis and ligamentum flavum thickening as well as a broad-based disc osteophyte complex. There is also moderate left foraminal stenosis.. Moderate central canal stenosis . L5-S1: Severe right facet arthrosis. Low signal structure in the posterolateral aspect of the right neural foramen may represent extruded disc material or osteophyte with severe crowding of the right neural foramen.  . Soft tissues are grossly unremarkable   IMPRESSION: Severe right facet arthrosis at L5-S1. Low signal structure in the  posterolateral aspect of the right neural foramen may represent extruded disc material or osteophyte with severe crowding of the right neural foramen.. Correlate for right L5  radiculopathy.  Severe right and moderate left foraminal stenosis at L4-5. Moderate central canal stenosis at this level similar to the prior.  Moderate right foraminal stenosis at L3-4  Overall minimal change from the prior exam     Objective:  VS:  HT:    WT:   BMI:     BP:(!) 141/96  HR:89bpm  TEMP:98.1 F (36.7 C)(Oral)  RESP:  Physical Exam  Ortho Exam Imaging: No results found.

## 2018-09-10 NOTE — Procedures (Signed)
Lumbar Facet Joint Nerve Denervation  Patient: Allen BrasilRaymond Kenneth Lisbon Jr.      Date of Birth: 1956-01-13 MRN: [id number] PCP: Marvis RepressSchaeffer, Stanley, MD      Visit Date: 09/08/2018   Universal Protocol:    Date/Time: 12/04/196:16 AM  Consent Given By: the patient  Position: PRONE  Additional Comments: Vital signs were monitored before and after the procedure. Patient was prepped and draped in the usual sterile fashion. The correct patient, procedure, and site was verified.   Injection Procedure Details:  Procedure Site One Meds Administered:  Meds ordered this encounter  Medications  . methylPREDNISolone acetate (DEPO-MEDROL) injection 40 mg     Laterality: Right  Location/Site:  L4-L5 L5-S1  Needle size: 18 G  Needle type: Radiofrequency cannula  Needle Placement: Along juncture of superior articular process and transverse pocess  Findings:  -Comments:  Procedure Details: For each desired target nerve, the corresponding transverse process (sacral ala for the L5 dorsal rami) was identified and the fluoroscope was positioned to square off the endplates of the corresponding vertebral body to achieve a true AP midline view.  The beam was then obliqued 15 to 20 degrees and caudally tilted 15 to 20 degrees to line up a trajectory along the target nerves. The skin over the target of the junction of superior articulating process and transverse process (sacral ala for the L5 dorsal rami) was infiltrated with 1ml of 1% Lidocaine without Epinephrine.  The 18 gauge 10mm active tip outer cannula was advanced in trajectory view to the target.  This procedure was repeated for each target nerve.  Then, for all levels, the outer cannula placement was fine-tuned and the position was then confirmed with bi-planar imaging.    Test stimulation was done both at sensory and motor levels to ensure there was no radicular stimulation. The target tissues were then infiltrated with 1 ml of 1%  Lidocaine without Epinephrine. Subsequently, a percutaneous neurotomy was carried out for 60 seconds at 80 degrees Celsius. The procedure was repeated with the cannula rotated 90 degrees, for duration of 60 seconds, one additional time at each level for a total of two lesions per level.  After the completion of the two lesions, 1 ml of injectate was delivered. It was then repeated for each facet joint nerve mentioned above. Appropriate radiographs were obtained to verify the probe placement during the neurotomy.   Additional Comments:  The patient tolerated the procedure well Dressing: Band-Aid    Post-procedure details: Patient was observed during the procedure. Post-procedure instructions were reviewed.  Patient left the clinic in stable condition.

## 2018-09-25 ENCOUNTER — Other Ambulatory Visit (INDEPENDENT_AMBULATORY_CARE_PROVIDER_SITE_OTHER): Payer: Self-pay | Admitting: Specialist

## 2018-09-25 NOTE — Telephone Encounter (Signed)
meloxicam refill request 

## 2018-10-21 ENCOUNTER — Other Ambulatory Visit (INDEPENDENT_AMBULATORY_CARE_PROVIDER_SITE_OTHER): Payer: Self-pay | Admitting: Surgery

## 2018-10-21 NOTE — Telephone Encounter (Signed)
Please advise 

## 2018-12-05 ENCOUNTER — Other Ambulatory Visit (INDEPENDENT_AMBULATORY_CARE_PROVIDER_SITE_OTHER): Payer: Self-pay | Admitting: Specialist

## 2018-12-05 NOTE — Telephone Encounter (Signed)
Methocarbamol refill request 

## 2018-12-20 ENCOUNTER — Other Ambulatory Visit (INDEPENDENT_AMBULATORY_CARE_PROVIDER_SITE_OTHER): Payer: Self-pay | Admitting: Specialist

## 2018-12-22 NOTE — Telephone Encounter (Signed)
Methocarbamol refill request 

## 2018-12-24 ENCOUNTER — Other Ambulatory Visit (INDEPENDENT_AMBULATORY_CARE_PROVIDER_SITE_OTHER): Payer: Self-pay | Admitting: Specialist

## 2018-12-24 NOTE — Telephone Encounter (Signed)
Gabapentin refill request 

## 2018-12-26 ENCOUNTER — Telehealth (INDEPENDENT_AMBULATORY_CARE_PROVIDER_SITE_OTHER): Payer: Self-pay | Admitting: Specialist

## 2018-12-26 NOTE — Telephone Encounter (Signed)
Patient left a message stating that he has jury duty on May 4 and wanted to know if Dr. Otelia Sergeant would write him a note excusing him from jury duty due to his back issues.  CB#[phone].  Thank you.

## 2018-12-29 NOTE — Telephone Encounter (Signed)
I called and advised that we would need a copy of the jury summons so we can provider the letter. He said he would bring it to our office this week.

## 2019-01-19 ENCOUNTER — Ambulatory Visit (INDEPENDENT_AMBULATORY_CARE_PROVIDER_SITE_OTHER): Payer: BLUE CROSS/BLUE SHIELD | Admitting: Specialist

## 2019-01-30 ENCOUNTER — Encounter (INDEPENDENT_AMBULATORY_CARE_PROVIDER_SITE_OTHER): Payer: Self-pay | Admitting: Specialist

## 2019-02-09 ENCOUNTER — Other Ambulatory Visit (INDEPENDENT_AMBULATORY_CARE_PROVIDER_SITE_OTHER): Payer: Self-pay | Admitting: Specialist

## 2019-02-09 NOTE — Telephone Encounter (Signed)
Methocarbamol refill request 

## 2019-03-03 ENCOUNTER — Telehealth: Payer: Self-pay | Admitting: Specialist

## 2019-03-03 NOTE — Telephone Encounter (Signed)
I called and lmom that he would need to bring [place] the jury duty letter so we can get the info off of it and get the letter ready.

## 2019-03-03 NOTE — Telephone Encounter (Signed)
Pt called in said he has jury duty on 03-12-19 and he wondering if he can bring this paper by for dr.nitka to sign stating he cant do it do to him not being able to sit for so long.  [phone]

## 2019-03-10 ENCOUNTER — Telehealth: Payer: Self-pay | Admitting: Specialist

## 2019-03-10 NOTE — Telephone Encounter (Signed)
Patient states letter he let with you should have not been left. Patient's wife requesting letter to be emailed to her once signed. Email: [email or web address] Please call patient to inform if you can do this, or if he needs to pick up.

## 2019-03-10 NOTE — Telephone Encounter (Signed)
Patient needs a letter for [employer] to excuse him. Patient needs it done today if possible. Please call patient to discuss.

## 2019-03-10 NOTE — Telephone Encounter (Signed)
I called and advised that Dr. Otelia Sergeant is out of the office today, however we do the a copy of the summons so that we can get info off of it and do the letter, I advised that as soon as he gets it done I would lt him know.

## 2019-03-11 ENCOUNTER — Telehealth: Payer: Self-pay | Admitting: Specialist

## 2019-03-11 ENCOUNTER — Encounter (INDEPENDENT_AMBULATORY_CARE_PROVIDER_SITE_OTHER): Payer: Self-pay | Admitting: Specialist

## 2019-03-11 NOTE — Telephone Encounter (Signed)
Note is ready and patient has been advised 

## 2019-03-11 NOTE — Telephone Encounter (Signed)
Patient called to stress the fact that he needs this letter asap. It could get him in so much trouble if not returned in a timely manner.  Please call patient to advise.  [phone]

## 2019-03-11 NOTE — Telephone Encounter (Signed)
Note is ready and patient has been advised

## 2019-03-16 ENCOUNTER — Other Ambulatory Visit (INDEPENDENT_AMBULATORY_CARE_PROVIDER_SITE_OTHER): Payer: Self-pay | Admitting: Specialist

## 2019-03-16 NOTE — Telephone Encounter (Signed)
Methocarbamol refill request 

## 2019-05-05 ENCOUNTER — Other Ambulatory Visit (INDEPENDENT_AMBULATORY_CARE_PROVIDER_SITE_OTHER): Payer: Self-pay | Admitting: Specialist

## 2019-05-05 NOTE — Telephone Encounter (Signed)
pls advise

## 2019-07-06 ENCOUNTER — Other Ambulatory Visit (INDEPENDENT_AMBULATORY_CARE_PROVIDER_SITE_OTHER): Payer: Self-pay | Admitting: Specialist

## 2019-07-10 ENCOUNTER — Ambulatory Visit (INDEPENDENT_AMBULATORY_CARE_PROVIDER_SITE_OTHER): Payer: BC Managed Care – PPO | Admitting: Specialist

## 2019-07-10 ENCOUNTER — Encounter: Payer: Self-pay | Admitting: Specialist

## 2019-07-10 ENCOUNTER — Ambulatory Visit: Payer: Self-pay

## 2019-07-10 VITALS — BP 126/82 | HR 85 | Ht 71.0 in | Wt 300.0 lb

## 2019-07-10 DIAGNOSIS — M160 Bilateral primary osteoarthritis of hip: Secondary | ICD-10-CM

## 2019-07-10 DIAGNOSIS — M47816 Spondylosis without myelopathy or radiculopathy, lumbar region: Secondary | ICD-10-CM | POA: Diagnosis not present

## 2019-07-10 DIAGNOSIS — M4316 Spondylolisthesis, lumbar region: Secondary | ICD-10-CM | POA: Diagnosis not present

## 2019-07-10 DIAGNOSIS — M5136 Other intervertebral disc degeneration, lumbar region: Secondary | ICD-10-CM | POA: Diagnosis not present

## 2019-07-10 DIAGNOSIS — M51369 Other intervertebral disc degeneration, lumbar region without mention of lumbar back pain or lower extremity pain: Secondary | ICD-10-CM

## 2019-07-10 MED ORDER — MELOXICAM 15 MG PO TABS: 15.0000 mg | ORAL_TABLET | Freq: Every day | ORAL | 6 refills | Status: DC

## 2019-07-10 NOTE — Patient Instructions (Addendum)
Avoid bending, stooping and avoid lifting weights greater than 10 lbs. Avoid prolong standing and walking. Avoid frequent bending and stooping  No lifting greater than 10 lbs. May use ice or moist heat for pain. Weight loss is of benefit. Handicap license is approved. Dr. Romona Curls secretary/Assistant will call to arrange for right L4-5 facet steroid injection  Meloxicam 15 mg po daily with meal or snack.

## 2019-07-10 NOTE — Progress Notes (Signed)
Office Visit Note   Patient: Allen Henry.           Date of Birth: 1956/09/26           MRN: [id number] Visit Date: 07/10/2019              Requested by: Marvis Repress, MD [address] [place],  [place] [address] PCP: Marvis Repress, MD   Assessment & Plan: Visit Diagnoses:  1. Spondylosis without myelopathy or radiculopathy, lumbar region   2. Spondylolisthesis, lumbar region   3. Degenerative disc disease, lumbar   4. Bilateral primary osteoarthritis of hip     Plan: Avoid bending, stooping and avoid lifting weights greater than 10 lbs. Avoid prolong standing and walking. Avoid frequent bending and stooping  No lifting greater than 10 lbs. May use ice or moist heat for pain. Weight loss is of benefit. Handicap license is approved. Dr. Cosby Blas secretary/Assistant will call to arrange for right L4-5 facet steroid injection  Meloxicam 15 mg po daily with meal or snack.  Follow-Up Instructions: Return in about 2 months (around 09/09/2019).   Orders:  Orders Placed This Encounter  Procedures  . XR Lumbar Spine 2-3 Views   No orders of the defined types were placed in this encounter.     Procedures: No procedures performed   Clinical Data: No additional findings.   Subjective: Chief Complaint  Patient presents with  . Lower Back - Follow-up, Pain    63 year old male with back pain and night pain with new bed it is firmer mattress, he snores and wife has him turn on the right side. The right thigh to the right mid foot goes numb. Taking tylenol in the middle of the night helps. They are using a sleep numb bed but the base changed. He hurt his left knee and it goes out here and again. He has pain in the middle of the night and gets up and sits in the recliner and then returns to bed. The pain is mainly back right lumbar and in the middle above the gluteal crease.    Review of Systems  Constitutional: Positive for activity change. Negative  for appetite change, chills, diaphoresis, fatigue, fever and unexpected weight change.  HENT: Negative.  Negative for congestion, dental problem, drooling, ear discharge, ear pain, facial swelling, hearing loss, mouth sores, nosebleeds, postnasal drip, rhinorrhea, sinus pressure, sinus pain, sneezing, sore throat, tinnitus, trouble swallowing and voice change.   Eyes: Negative for photophobia, pain, discharge, redness, itching and visual disturbance.  Respiratory: Positive for shortness of breath and wheezing. Negative for apnea, cough, choking, chest tightness and stridor.   Cardiovascular: Negative.  Negative for chest pain, palpitations and leg swelling.  Gastrointestinal: Negative.  Negative for abdominal distention, abdominal pain, anal bleeding, blood in stool, constipation, diarrhea, nausea, rectal pain and vomiting.  Endocrine: Negative for cold intolerance, heat intolerance, polydipsia, polyphagia and polyuria.  Genitourinary: Negative.  Negative for difficulty urinating, dysuria, enuresis, flank pain, frequency, genital sores, hematuria and urgency.  Musculoskeletal: Positive for back pain. Negative for arthralgias, gait problem, joint swelling, myalgias, neck pain and neck stiffness.  Skin: Negative.  Negative for color change, pallor, rash and wound.  Allergic/Immunologic: Negative for environmental allergies, food allergies and immunocompromised state.  Neurological: Negative.  Negative for dizziness, tremors, seizures, syncope, facial asymmetry, speech difficulty, weakness, light-headedness, numbness and headaches.  Hematological: Negative.  Negative for adenopathy. Does not bruise/bleed easily.     Objective: Vital Signs: BP 126/82 (BP  Location: Left Arm, Patient Position: Sitting)   Pulse 85   Ht 5\' 11"  (1.803 m)   Wt 300 lb (136.1 kg)   BMI 41.84 kg/m   Physical Exam  Back Exam   Tenderness  The patient is experiencing tenderness in the lumbar.  Range of Motion   Extension: abnormal  Flexion: normal  Lateral bend right: normal  Lateral bend left: normal  Rotation right: normal  Rotation left: normal   Muscle Strength  Right Quadriceps:  5/5  Left Quadriceps:  5/5  Right Hamstrings:  5/5  Left Hamstrings:  5/5   Tests  Straight leg raise right: negative Straight leg raise left: negative  Reflexes  Patellar: 0/4 Achilles: 0/4 Babinski's sign: normal   Other  Toe walk: normal Heel walk: normal Sensation: normal Gait: normal       Specialty Comments:  No specialty comments available.  Imaging: No results found.   PMFS History: Patient Active Problem List   Diagnosis Date Noted  . Spinal stenosis of lumbar region 06/19/2017    Priority: High    Class: Chronic  . Status post lumbar laminectomy 06/19/2017   Past Medical History:  Diagnosis Date  . Arthritis   . Headache   . Pneumonia   . Vertigo     Family History  Problem Relation Age of Onset  . Heart attack Father   . Cancer Father   . Cancer Mother     Past Surgical History:  Procedure Laterality Date  . COLONOSCOPY    . LUMBAR LAMINECTOMY N/A 06/19/2017   Procedure: RIGHT LUMBAR THREE-FOUE AND LUMBAR FIVE-SACRAL ONE  PARTIAL HEMILAMINECTOMY, BILATERAL PARTIAL HEMILAMINECTOMY LUMBAR FOUR-FIVE;  Surgeon: Jessy Oto, MD;  Location: [facility];  Service: Orthopedics;  Laterality: N/A;  RIGHT Lumbar three-four AND Lumbar five-Sacrum one  PARTIAL HEMILAMINECTOMY, BILATERAL PARTIAL HEMILAMINECTOMY Lumbar four-five   . TONSILLECTOMY     Social History   Occupational History  . Not on file  Tobacco Use  . Smoking status: Former Smoker    Types: Cigarettes    Quit date: 06/12/2002    Years since quitting: 17.0  . Smokeless tobacco: Never Used  Substance and Sexual Activity  . Alcohol use: No  . Drug use: No    Types: PCP  . Sexual activity: Not on file

## 2019-08-06 ENCOUNTER — Encounter: Payer: BC Managed Care – PPO | Admitting: Physical Medicine and Rehabilitation

## 2019-08-25 ENCOUNTER — Ambulatory Visit: Payer: Self-pay

## 2019-08-25 ENCOUNTER — Ambulatory Visit (INDEPENDENT_AMBULATORY_CARE_PROVIDER_SITE_OTHER): Payer: BC Managed Care – PPO | Admitting: Physical Medicine and Rehabilitation

## 2019-08-25 ENCOUNTER — Encounter: Payer: Self-pay | Admitting: Physical Medicine and Rehabilitation

## 2019-08-25 ENCOUNTER — Other Ambulatory Visit: Payer: Self-pay

## 2019-08-25 VITALS — BP 120/79 | HR 85

## 2019-08-25 DIAGNOSIS — M47816 Spondylosis without myelopathy or radiculopathy, lumbar region: Secondary | ICD-10-CM | POA: Diagnosis not present

## 2019-08-25 MED ORDER — METHYLPREDNISOLONE ACETATE 80 MG/ML IJ SUSP
80.0000 mg | Freq: Once | INTRAMUSCULAR | Status: AC
  Administered 2019-08-25: 80 mg

## 2019-08-25 NOTE — Progress Notes (Signed)
Numeric Pain Rating Scale and Functional Assessment Average Pain 6   In the last MONTH (on 0-10 scale) has pain interfered with the following?  1. General activity like being  able to carry out your everyday physical activities such as walking, climbing stairs, carrying groceries, or moving a chair?  Rating(8)    +Driver, -BT, -Dye Allergies.  

## 2019-09-09 ENCOUNTER — Other Ambulatory Visit: Payer: Self-pay

## 2019-09-09 ENCOUNTER — Ambulatory Visit (INDEPENDENT_AMBULATORY_CARE_PROVIDER_SITE_OTHER): Payer: BC Managed Care – PPO | Admitting: Specialist

## 2019-09-09 ENCOUNTER — Encounter: Payer: Self-pay | Admitting: Specialist

## 2019-09-09 VITALS — BP 147/86 | HR 76 | Ht 71.0 in | Wt 300.0 lb

## 2019-09-09 DIAGNOSIS — M48061 Spinal stenosis, lumbar region without neurogenic claudication: Secondary | ICD-10-CM | POA: Diagnosis not present

## 2019-09-09 DIAGNOSIS — M5136 Other intervertebral disc degeneration, lumbar region: Secondary | ICD-10-CM

## 2019-09-09 MED ORDER — METHOCARBAMOL 500 MG PO TABS: ORAL_TABLET | ORAL | 0 refills | Status: DC

## 2019-09-09 NOTE — Progress Notes (Signed)
Office Visit Note   Patient: Allen Henry.           Date of Birth: 1956-10-06           MRN: [id number] Visit Date: 09/09/2019              Requested by: Ardelle Anton, MD [address] [place],  [place] [address] PCP: Ardelle Anton, MD   Assessment & Plan: Visit Diagnoses: No diagnosis found.  Plan: Avoid bending, stooping and avoid lifting weights greater than 10 lbs. Avoid prolong standing and walking. Avoid frequent bending and stooping  No lifting greater than 10 lbs. May use ice or moist heat for pain. Weight loss is of benefit. Handicap license is approved.  Follow-Up Instructions: Return in about 1 year (around 09/08/2020).   Orders:  No orders of the defined types were placed in this encounter.  No orders of the defined types were placed in this encounter.     Procedures: No procedures performed   Clinical Data: No additional findings.   Subjective: Chief Complaint  Patient presents with  . Lower Back - Follow-up    63 year old male with history of back pain and lumbar spinal stenosis and underwent lumbar laminectomy L3-4 and L4-5. His pain is improved but he does have persistent right leg pain and discomfort into the right posterior thigh and buttock. He reports some loss of harmony in his relationship with his wife.    Review of Systems  Constitutional: Negative.   HENT: Negative.   Eyes: Negative.   Respiratory: Negative.   Cardiovascular: Negative.   Gastrointestinal: Negative.   Endocrine: Negative.   Genitourinary: Negative.   Musculoskeletal: Positive for back pain. Negative for arthralgias, gait problem, joint swelling, myalgias, neck pain and neck stiffness.  Skin: Negative.   Allergic/Immunologic: Negative.   Neurological: Negative.   Hematological: Negative.   Psychiatric/Behavioral: Negative.      Objective: Vital Signs: BP (!) 147/86 (BP Location: Left Arm, Patient Position: Sitting)   Pulse 76   Ht 5'  11" (1.803 m)   Wt 300 lb (136.1 kg)   BMI 41.84 kg/m   Physical Exam Constitutional:      Appearance: He is well-developed.  HENT:     Head: Normocephalic and atraumatic.  Eyes:     Pupils: Pupils are equal, round, and reactive to light.  Neck:     Musculoskeletal: Normal range of motion and neck supple.  Pulmonary:     Effort: Pulmonary effort is normal.     Breath sounds: Normal breath sounds.  Abdominal:     General: Bowel sounds are normal.     Palpations: Abdomen is soft.  Skin:    General: Skin is warm and dry.  Neurological:     Mental Status: He is alert and oriented to person, place, and time.  Psychiatric:        Behavior: Behavior normal.        Thought Content: Thought content normal.        Judgment: Judgment normal.     Back Exam   Tenderness  The patient is experiencing tenderness in the lumbar.  Range of Motion  Extension: abnormal  Flexion: abnormal  Lateral bend right: normal  Lateral bend left: normal  Rotation right: normal  Rotation left: normal   Muscle Strength  Right Quadriceps:  5/5  Left Quadriceps:  5/5  Right Hamstrings:  5/5  Left Hamstrings:  5/5   Tests  Straight leg raise right:  negative Straight leg raise left: negative  Reflexes  Patellar: 0/4 Achilles: 0/4 Babinski's sign: normal   Other  Toe walk: normal Heel walk: normal Sensation: normal Gait: normal  Erythema: no back redness Scars: present      Specialty Comments:  No specialty comments available.  Imaging: No results found.   PMFS History: Patient Active Problem List   Diagnosis Date Noted  . Spinal stenosis of lumbar region 06/19/2017    Priority: High    Class: Chronic  . Status post lumbar laminectomy 06/19/2017   Past Medical History:  Diagnosis Date  . Arthritis   . Headache   . Pneumonia   . Vertigo     Family History  Problem Relation Age of Onset  . Heart attack Father   . Cancer Father   . Cancer Mother     Past  Surgical History:  Procedure Laterality Date  . COLONOSCOPY    . LUMBAR LAMINECTOMY N/A 06/19/2017   Procedure: RIGHT LUMBAR THREE-FOUE AND LUMBAR FIVE-SACRAL ONE  PARTIAL HEMILAMINECTOMY, BILATERAL PARTIAL HEMILAMINECTOMY LUMBAR FOUR-FIVE;  Surgeon: Kerrin Champagne, MD;  Location: [facility] OR;  Service: Orthopedics;  Laterality: N/A;  RIGHT Lumbar three-four AND Lumbar five-Sacrum one  PARTIAL HEMILAMINECTOMY, BILATERAL PARTIAL HEMILAMINECTOMY Lumbar four-five   . TONSILLECTOMY     Social History   Occupational History  . Not on file  Tobacco Use  . Smoking status: Former Smoker    Types: Cigarettes    Quit date: 06/12/2002    Years since quitting: 17.2  . Smokeless tobacco: Never Used  Substance and Sexual Activity  . Alcohol use: No  . Drug use: No    Types: PCP  . Sexual activity: Not on file

## 2019-09-09 NOTE — Addendum Note (Signed)
Addended by: Basil Dess on: 09/09/2019 11:11 AM   Modules accepted: Orders

## 2019-09-09 NOTE — Patient Instructions (Signed)
Avoid bending, stooping and avoid lifting weights greater than 10 lbs. Avoid prolong standing and walking. Avoid frequent bending and stooping  No lifting greater than 10 lbs. May use ice or moist heat for pain. Weight loss is of benefit. Handicap license is approved.  

## 2019-12-24 ENCOUNTER — Other Ambulatory Visit (INDEPENDENT_AMBULATORY_CARE_PROVIDER_SITE_OTHER): Payer: Self-pay | Admitting: Specialist

## 2019-12-30 NOTE — Procedures (Signed)
Lumbar Diagnostic Facet Joint Nerve Block with Fluoroscopic Guidance   Patient: Allen Henry.      Date of Birth: [birth year] MRN: [id number] PCP: Marvis Repress, MD      Visit Date: 08/25/2019   Universal Protocol:    Date/Time: 03/24/216:16 AM  Consent Given By: the patient  Position: PRONE  Additional Comments: Vital signs were monitored before and after the procedure. Patient was prepped and draped in the usual sterile fashion. The correct patient, procedure, and site was verified.   Injection Procedure Details:  Procedure Site One Meds Administered:  Meds ordered this encounter  Medications  . methylPREDNISolone acetate (DEPO-MEDROL) injection 80 mg     Laterality: Right  Location/Site:  L4-L5  Needle size: 22 ga.  Needle type:spinal  Needle Placement: Oblique pedical  Findings:   -Comments: There was excellent flow of contrast along the articular pillars without intravascular flow.  Procedure Details: The fluoroscope beam is vertically oriented in AP and then obliqued 15 to 20 degrees to the ipsilateral side of the desired nerve to achieve the "Scotty dog" appearance.  The skin over the target area of the junction of the superior articulating process and the transverse process (sacral ala if blocking the L5 dorsal rami) was locally anesthetized with a 1 ml volume of 1% Lidocaine without Epinephrine.  The spinal needle was inserted and advanced in a trajectory view down to the target.   After contact with periosteum and negative aspirate for blood and CSF, correct placement without intravascular or epidural spread was confirmed by injecting 0.5 ml. of Isovue-250.  A spot radiograph was obtained of this image.    Next, a 0.5 ml. volume of the injectate described above was injected. The needle was then redirected to the other facet joint nerves mentioned above if needed.  Prior to the procedure, the patient was given a Pain Diary which was completed  for baseline measurements.  After the procedure, the patient rated their pain every 30 minutes and will continue rating at this frequency for a total of 5 hours.  The patient has been asked to complete the Diary and return to [place] by mail, fax or hand delivered as soon as possible.   Additional Comments:  The patient tolerated the procedure well Dressing: 2 x 2 sterile gauze and Band-Aid    Post-procedure details: Patient was observed during the procedure. Post-procedure instructions were reviewed.  Patient left the clinic in stable condition.

## 2019-12-30 NOTE — Progress Notes (Signed)
Allen Henry. - 64 y.o. male MRN [id number]  Date of birth: [birth year]  Office Visit Note: Visit Date: 08/25/2019 PCP: Ardelle Anton, MD Referred by: Ardelle Anton, MD  Subjective: Chief Complaint  Patient presents with  . Lower Back - Pain  . Right Thigh - Pain   HPI:  Allen Jarrett. is a 64 y.o. male who comes in today HPI ROS Otherwise per HPI.  Assessment & Plan: Visit Diagnoses:  1. Spondylosis without myelopathy or radiculopathy, lumbar region     Plan: No additional findings.   Meds & Orders:  Meds ordered this encounter  Medications  . methylPREDNISolone acetate (DEPO-MEDROL) injection 80 mg    Orders Placed This Encounter  Procedures  . Facet Injection  . XR C-ARM NO REPORT    Follow-up: Return for visit to requesting physician as needed.   Procedures: No procedures performed  Lumbar Diagnostic Facet Joint Nerve Block with Fluoroscopic Guidance   Patient: Allen Hallum.      Date of Birth: 12/02/1955 MRN: [id number] PCP: Ardelle Anton, MD      Visit Date: 08/25/2019   Universal Protocol:    Date/Time: 03/24/216:16 AM  Consent Given By: the patient  Position: PRONE  Additional Comments: Vital signs were monitored before and after the procedure. Patient was prepped and draped in the usual sterile fashion. The correct patient, procedure, and site was verified.   Injection Procedure Details:  Procedure Site One Meds Administered:  Meds ordered this encounter  Medications  . methylPREDNISolone acetate (DEPO-MEDROL) injection 80 mg     Laterality: Right  Location/Site:  L4-L5  Needle size: 22 ga.  Needle type:spinal  Needle Placement: Oblique pedical  Findings:   -Comments: There was excellent flow of contrast along the articular pillars without intravascular flow.  Procedure Details: The fluoroscope beam is vertically oriented in AP and then obliqued 15 to 20 degrees to the  ipsilateral side of the desired nerve to achieve the "Scotty dog" appearance.  The skin over the target area of the junction of the superior articulating process and the transverse process (sacral ala if blocking the L5 dorsal rami) was locally anesthetized with a 1 ml volume of 1% Lidocaine without Epinephrine.  The spinal needle was inserted and advanced in a trajectory view down to the target.   After contact with periosteum and negative aspirate for blood and CSF, correct placement without intravascular or epidural spread was confirmed by injecting 0.5 ml. of Isovue-250.  A spot radiograph was obtained of this image.    Next, a 0.5 ml. volume of the injectate described above was injected. The needle was then redirected to the other facet joint nerves mentioned above if needed.  Prior to the procedure, the patient was given a Pain Diary which was completed for baseline measurements.  After the procedure, the patient rated their pain every 30 minutes and will continue rating at this frequency for a total of 5 hours.  The patient has been asked to complete the Diary and return to [place] by mail, fax or hand delivered as soon as possible.   Additional Comments:  The patient tolerated the procedure well Dressing: 2 x 2 sterile gauze and Band-Aid    Post-procedure details: Patient was observed during the procedure. Post-procedure instructions were reviewed.  Patient left the clinic in stable condition.    Clinical History: Acute Interface, Incoming Rad Results - 03/27/2017  9:41 AM EDT TECHNIQUE: Multiplanar, multisequence MR imaging of the lumbar spine  was obtained without contrast  COMPARISON: 04/27/2015  INDICATION: Low back pain  FINDINGS:  . The conus medullaris appears normal, terminating at the L1 level. No abnormal signal demonstrated within the visualized distal spinal cord. Cauda equina unremarkable.  . Vertebral body heights maintained. Marrow signal within normal limits for age.   Alignment anatomic.  . The visualized lower thoracic spine demonstrates no disc herniation, significant central canal or foraminal stenosis.  . T12-L1: No disc herniation, significant central canal or foraminal stenosis. Marland Kitchen L1-L2: No disc herniation, significant central canal or foraminal stenosis. Marland Kitchen L2-L3: No disc herniation, significant central canal or foraminal stenosis. Marland Kitchen L3-L4: Moderate right foraminal stenosis secondary to broad-based disc protrusion and facet arthrosis. Marland Kitchen L4-L5: Severe right foraminal stenosis secondary to facet arthrosis and ligamentum flavum thickening as well as a broad-based disc osteophyte complex. There is also moderate left foraminal stenosis.. Moderate central canal stenosis . L5-S1: Severe right facet arthrosis. Low signal structure in the posterolateral aspect of the right neural foramen may represent extruded disc material or osteophyte with severe crowding of the right neural foramen.  . Soft tissues are grossly unremarkable   IMPRESSION: Severe right facet arthrosis at L5-S1. Low signal structure in the posterolateral aspect of the right neural foramen may represent extruded disc material or osteophyte with severe crowding of the right neural foramen.. Correlate for right L5  radiculopathy.  Severe right and moderate left foraminal stenosis at L4-5. Moderate central canal stenosis at this level similar to the prior.  Moderate right foraminal stenosis at L3-4  Overall minimal change from the prior exam     Objective:  VS:  HT:    WT:   BMI:     BP:120/79  HR:85bpm  TEMP: ( )  RESP:  Physical Exam  Ortho Exam Imaging: No results found.

## 2020-02-26 ENCOUNTER — Other Ambulatory Visit: Payer: Self-pay | Admitting: Specialist

## 2020-02-26 NOTE — Telephone Encounter (Signed)
Please advise 

## 2020-06-06 ENCOUNTER — Telehealth: Payer: Self-pay | Admitting: Physical Medicine and Rehabilitation

## 2020-06-06 NOTE — Telephone Encounter (Signed)
Needs auth and scheduling for [id number] and [phone].

## 2020-06-06 NOTE — Telephone Encounter (Signed)
Patient called requesting a call back to set back injection appt. Please call patient at [phone].

## 2020-06-06 NOTE — Telephone Encounter (Signed)
Try to get auth for RFA of L4-5 and L5-S1 facet

## 2020-06-06 NOTE — Telephone Encounter (Signed)
Patient had right L4-5 facet injection on 08/25/2019. Prior to that he had right L4-5 and L5-S1 RFA on 09/08/18. Please advise.

## 2020-06-10 ENCOUNTER — Telehealth: Payer: Self-pay | Admitting: Physical Medicine and Rehabilitation

## 2020-06-10 NOTE — Telephone Encounter (Signed)
chanda with [employer] needs shena to fax over the pts medical records so they can proceed with surgery  Fax# [phone] Chanda# [phone]

## 2020-06-15 NOTE — Telephone Encounter (Signed)
Faxed over Clinic note that was requested.

## 2020-06-15 NOTE — Telephone Encounter (Signed)
Called pt to inform him that his insurance called for more clinic note that was send over and its still pending. I will call pt when I have more information.

## 2020-06-20 NOTE — Telephone Encounter (Signed)
Called pt and sch for OV on 07/05/20.

## 2020-06-22 ENCOUNTER — Telehealth: Payer: Self-pay | Admitting: Specialist

## 2020-06-22 NOTE — Telephone Encounter (Signed)
Pt called wanting to know if we performed drug tests; pt would like a CB with answer  [phone]

## 2020-06-22 NOTE — Telephone Encounter (Signed)
I called and advised that we do not do these here in our office, I advised that and urgent care can do it,

## 2020-06-28 ENCOUNTER — Other Ambulatory Visit: Payer: Self-pay | Admitting: Specialist

## 2020-07-06 ENCOUNTER — Ambulatory Visit (INDEPENDENT_AMBULATORY_CARE_PROVIDER_SITE_OTHER): Payer: BC Managed Care – PPO | Admitting: Physical Medicine and Rehabilitation

## 2020-07-06 ENCOUNTER — Encounter: Payer: Self-pay | Admitting: Physical Medicine and Rehabilitation

## 2020-07-06 ENCOUNTER — Other Ambulatory Visit: Payer: Self-pay

## 2020-07-06 VITALS — BP 146/85 | HR 82

## 2020-07-06 DIAGNOSIS — M545 Low back pain, unspecified: Secondary | ICD-10-CM

## 2020-07-06 DIAGNOSIS — M47816 Spondylosis without myelopathy or radiculopathy, lumbar region: Secondary | ICD-10-CM | POA: Diagnosis not present

## 2020-07-06 DIAGNOSIS — S39012A Strain of muscle, fascia and tendon of lower back, initial encounter: Secondary | ICD-10-CM | POA: Diagnosis not present

## 2020-07-06 DIAGNOSIS — G8929 Other chronic pain: Secondary | ICD-10-CM

## 2020-07-06 DIAGNOSIS — Z9889 Other specified postprocedural states: Secondary | ICD-10-CM | POA: Diagnosis not present

## 2020-07-06 MED ORDER — BACLOFEN 10 MG PO TABS: 10.0000 mg | ORAL_TABLET | Freq: Three times a day (TID) | ORAL | 0 refills | Status: DC | PRN

## 2020-07-06 NOTE — Progress Notes (Signed)
Allen Henry. - 64 y.o. male MRN [id number]  Date of birth: [birth year]  Office Visit Note: Visit Date: 07/06/2020 PCP: Marvis Repress, MD Referred by: Marvis Repress, MD  Subjective: Chief Complaint  Patient presents with  . Lower Back - Pain   HPI: Allen Henry. is a 64 y.o. male who comes in today For evaluation and management of chronic worsening recalcitrant right axial low back pain at the request of Dr. Vira Browns.  He is followed by Dr. Vira Browns from a spine surgery standpoint.  His notes can be reviewed.  Patient has had prior lumbar laminectomy without any residual radiculopathy or radicular type pain.  By way of brief review in terms of chronic pain the patient has had prior lumbar radiofrequency ablation of the L4-5 and L5-S1 facet joints on the right.  This was completed at the end of 2019.  We actually saw the patient at Dr. Sherril Cong request for a diagnostic facet block approximately a year later in December 2020.  Dr. Otelia Sergeant from a diagnostic standpoint at the time did not want to repeat the ablation but did want to see if diagnostic facet joint block was beneficial and it was.  Patient today reports the ablation procedure on the right with 60% effective for over a year.  He has had MRI of the lumbar spine this is reviewed below.  His case is complicated by depression which is pretty severe as well as morbid obesity.  He has had physical therapy in the past and continues with some home exercises but he says he is not very faithful with that.  He does do a lot of activities around the house and tries to stay active.  Patient uses a combination of gabapentin as well as Tylenol and baclofen and Robaxin for pain relief.  These helped to some degree.  He has had no focal weakness no new trauma no radicular pain.  Imaging has shown mainly facet arthritic change.  As a secondary issue he does report today a recent episode of bending down and having pretty  acute onset of pain.  This sounds like more of a lumbar sprain strain and it is a different pain than he normally has.  This was severe when it first happened and has diminished over the last several days.  He was reaching down to pick up an object and was a very heavy but when he bent down he did just get sort of a twinge feeling with severe pain afterwards.  No radicular complaints again.  No specific trauma other than just the bending.  Review of Systems  Musculoskeletal: Positive for back pain.  All other systems reviewed and are negative.  Otherwise per HPI.  Assessment & Plan: Visit Diagnoses:  1. Spondylosis without myelopathy or radiculopathy, lumbar region   2. Chronic bilateral low back pain without sciatica   3. Status post lumbar laminectomy   4. Strain of lumbar region, initial encounter     Plan: Findings:  1.  Chronic worsening severe axial low back pain right sided predominant.  This is been a chronic ongoing situation with him with 60% relief from prior lumbar radiofrequency ablation of L4-5 and L5-S1 on the right.  We have not done an ablation on the left.  He has MRI findings of facet arthropathy without major stenosis.  He has no radicular leg pain or claudication.  He continues with medication and he has had physical therapy and all those notes through Dr.  Otelia Sergeant can be reviewed.  We will get preauthorization for repeat radiofrequency ablation of the L4-5 and L5-S1 facet joints on the right.  He will continue with other continued care.  2.  Lumbar sprain strain recently with a history of intermittent sprain strain at times.  His symptoms are getting better.  He may benefit from muscle relaxer at this point and I did refill baclofen.  We talked about gentle stretching and using heat and ice.  We discussed at length the history of intermittent sprain strains that can just happen and they can be severe at times.  This is separate from this chronic condition above and I think is  going to be self-limiting.  He states it usually is self-limited.  Talked about core strengthening and weight reduction.    Meds & Orders:  Meds ordered this encounter  Medications  . baclofen (LIORESAL) 10 MG tablet    Sig: Take 1 tablet (10 mg total) by mouth every 8 (eight) hours as needed for muscle spasms (Pain).    Dispense:  60 tablet    Refill:  0   No orders of the defined types were placed in this encounter.   Follow-up: Return for Repeat right L4-5 and L5-S1 facet joint ablation.   Procedures: No procedures performed  No notes on file   Clinical History: Acute Interface, Incoming Rad Results - 03/27/2017  9:41 AM EDT TECHNIQUE: Multiplanar, multisequence MR imaging of the lumbar spine was obtained without contrast  COMPARISON: 04/27/2015  INDICATION: Low back pain  FINDINGS:  . The conus medullaris appears normal, terminating at the L1 level. No abnormal signal demonstrated within the visualized distal spinal cord. Cauda equina unremarkable.  . Vertebral body heights maintained. Marrow signal within normal limits for age.  Alignment anatomic.  . The visualized lower thoracic spine demonstrates no disc herniation, significant central canal or foraminal stenosis.  . T12-L1: No disc herniation, significant central canal or foraminal stenosis. Marland Kitchen L1-L2: No disc herniation, significant central canal or foraminal stenosis. Marland Kitchen L2-L3: No disc herniation, significant central canal or foraminal stenosis. Marland Kitchen L3-L4: Moderate right foraminal stenosis secondary to broad-based disc protrusion and facet arthrosis. Marland Kitchen L4-L5: Severe right foraminal stenosis secondary to facet arthrosis and ligamentum flavum thickening as well as a broad-based disc osteophyte complex. There is also moderate left foraminal stenosis.. Moderate central canal stenosis . L5-S1: Severe right facet arthrosis. Low signal structure in the posterolateral aspect of the right neural foramen may represent extruded disc  material or osteophyte with severe crowding of the right neural foramen.  . Soft tissues are grossly unremarkable   IMPRESSION: Severe right facet arthrosis at L5-S1. Low signal structure in the posterolateral aspect of the right neural foramen may represent extruded disc material or osteophyte with severe crowding of the right neural foramen.. Correlate for right L5  radiculopathy.  Severe right and moderate left foraminal stenosis at L4-5. Moderate central canal stenosis at this level similar to the prior.  Moderate right foraminal stenosis at L3-4  Overall minimal change from the prior exam   He reports that he quit smoking about 18 years ago. His smoking use included cigarettes. He has never used smokeless tobacco. No results for input(s): HGBA1C, LABURIC in the last 8760 hours.  Objective:  VS:  HT:    WT:   BMI:     BP:(!) 146/85  HR:82bpm  TEMP: ( )  RESP:  Physical Exam Vitals and nursing note reviewed.  Constitutional:      General: He  is not in acute distress.    Appearance: Normal appearance. He is well-developed. He is obese. He is not ill-appearing.  HENT:     Head: Normocephalic and atraumatic.     Right Ear: External ear normal.     Left Ear: External ear normal.  Eyes:     Extraocular Movements: Extraocular movements intact.     Conjunctiva/sclera: Conjunctivae normal.     Pupils: Pupils are equal, round, and reactive to light.  Cardiovascular:     Rate and Rhythm: Normal rate.     Pulses: Normal pulses.     Heart sounds: Normal heart sounds.  Pulmonary:     Effort: Pulmonary effort is normal. No respiratory distress.  Abdominal:     General: There is no distension.     Palpations: Abdomen is soft.  Musculoskeletal:        General: No tenderness or signs of injury.     Cervical back: Normal range of motion and neck supple. No rigidity.     Right lower leg: No edema.     Left lower leg: No edema.     Comments: Patient has good distal strength without  clonus. Patient somewhat slow to rise from a seated position to full extension.  There is concordant right low back pain with facet loading and lumbar spine extension rotation.  There are no definitive trigger points but the patient is somewhat tender across the lower back and PSIS.  There is no pain with hip rotation.   Skin:    General: Skin is warm and dry.     Findings: No erythema or rash.  Neurological:     General: No focal deficit present.     Mental Status: He is alert and oriented to person, place, and time.     Sensory: No sensory deficit.     Motor: No weakness or abnormal muscle tone.     Coordination: Coordination normal.     Gait: Gait normal.  Psychiatric:        Mood and Affect: Mood normal.        Behavior: Behavior normal.     Ortho Exam  Imaging: No results found.  Past Medical/Family/Surgical/Social History: Medications & Allergies reviewed per EMR, new medications updated. Patient Active Problem List   Diagnosis Date Noted  . Spinal stenosis of lumbar region 06/19/2017    Class: Chronic  . Status post lumbar laminectomy 06/19/2017   Past Medical History:  Diagnosis Date  . Arthritis   . Headache   . Pneumonia   . Vertigo    Family History  Problem Relation Age of Onset  . Heart attack Father   . Cancer Father   . Cancer Mother    Past Surgical History:  Procedure Laterality Date  . COLONOSCOPY    . LUMBAR LAMINECTOMY N/A 06/19/2017   Procedure: RIGHT LUMBAR THREE-FOUE AND LUMBAR FIVE-SACRAL ONE  PARTIAL HEMILAMINECTOMY, BILATERAL PARTIAL HEMILAMINECTOMY LUMBAR FOUR-FIVE;  Surgeon: Kerrin ChampagneNitka, James E, MD;  Location: [facility] OR;  Service: Orthopedics;  Laterality: N/A;  RIGHT Lumbar three-four AND Lumbar five-Sacrum one  PARTIAL HEMILAMINECTOMY, BILATERAL PARTIAL HEMILAMINECTOMY Lumbar four-five   . TONSILLECTOMY     Social History   Occupational History  . Not on file  Tobacco Use  . Smoking status: Former Smoker    Types: Cigarettes    Quit date:  06/12/2002    Years since quitting: 18.0  . Smokeless tobacco: Never Used  Vaping Use  . Vaping Use: Never used  Substance and  Sexual Activity  . Alcohol use: No  . Drug use: No    Types: PCP  . Sexual activity: Not on file

## 2020-07-06 NOTE — Progress Notes (Signed)
Low back pain. Pain is mostly on the right. No leg pain. Patient reports 60% relief from previous RFA.  Numeric Pain Rating Scale and Functional Assessment Average Pain 4 Pain Right Now 3 My pain is constant, burning, dull and aching Pain is worse with: bending and some activites Pain improves with: rest   In the last MONTH (on 0-10 scale) has pain interfered with the following?  1. General activity like being  able to carry out your everyday physical activities such as walking, climbing stairs, carrying groceries, or moving a chair?  Rating(4)  2. Relation with others like being able to carry out your usual social activities and roles such as  activities at home, at work and in your community. Rating(4)  3. Enjoyment of life such that you have  been bothered by emotional problems such as feeling anxious, depressed or irritable?  Rating(2)

## 2020-07-12 ENCOUNTER — Telehealth: Payer: Self-pay | Admitting: Physical Medicine and Rehabilitation

## 2020-07-12 NOTE — Telephone Encounter (Signed)
Meant to go to me?

## 2020-07-12 NOTE — Telephone Encounter (Signed)
Needs auth and scheduling for right L4-5 and L5-S1 RFA. 

## 2020-07-14 NOTE — Telephone Encounter (Signed)
Auth PENDING °

## 2020-07-19 NOTE — Telephone Encounter (Signed)
More notes has been sen over.

## 2020-07-21 ENCOUNTER — Other Ambulatory Visit: Payer: Self-pay | Admitting: Specialist

## 2020-07-22 ENCOUNTER — Telehealth: Payer: Self-pay | Admitting: Radiology

## 2020-07-22 ENCOUNTER — Other Ambulatory Visit: Payer: Self-pay

## 2020-07-22 MED ORDER — MELOXICAM 15 MG PO TABS: ORAL_TABLET | ORAL | 6 refills | Status: AC

## 2020-07-22 NOTE — Telephone Encounter (Signed)
Pt has been approve Ref# [id number]. Pt can be sch.

## 2020-07-22 NOTE — Telephone Encounter (Signed)
E-prescribe down. I called Meloxicam in to pharmacy.

## 2020-07-22 NOTE — Telephone Encounter (Signed)
Called pt and sch 11/22.

## 2020-08-29 ENCOUNTER — Ambulatory Visit: Payer: Self-pay

## 2020-08-29 ENCOUNTER — Other Ambulatory Visit: Payer: Self-pay

## 2020-08-29 ENCOUNTER — Ambulatory Visit (INDEPENDENT_AMBULATORY_CARE_PROVIDER_SITE_OTHER): Payer: BC Managed Care – PPO | Admitting: Physical Medicine and Rehabilitation

## 2020-08-29 VITALS — BP 144/93 | HR 86

## 2020-08-29 DIAGNOSIS — M47816 Spondylosis without myelopathy or radiculopathy, lumbar region: Secondary | ICD-10-CM

## 2020-08-29 MED ORDER — BETAMETHASONE SOD PHOS & ACET 6 (3-3) MG/ML IJ SUSP
12.0000 mg | Freq: Once | INTRAMUSCULAR | Status: AC
  Administered 2020-08-29: 12 mg

## 2020-08-29 NOTE — Progress Notes (Signed)
Pt state lower back pain that travels up the back. Pt state walking, standing and sitting makes the pain worse. Pt state he takes pain meds and lay down to help ease the pain.  Numeric Pain Rating Scale and Functional Assessment Average Pain 6   In the last MONTH (on 0-10 scale) has pain interfered with the following?  1. General activity like being  able to carry out your everyday physical activities such as walking, climbing stairs, carrying groceries, or moving a chair?  Rating(8)   +Driver, -BT, -Dye Allergies.

## 2020-09-08 ENCOUNTER — Other Ambulatory Visit: Payer: Self-pay

## 2020-09-08 ENCOUNTER — Ambulatory Visit: Payer: Self-pay

## 2020-09-08 ENCOUNTER — Encounter: Payer: Self-pay | Admitting: Specialist

## 2020-09-08 ENCOUNTER — Ambulatory Visit (INDEPENDENT_AMBULATORY_CARE_PROVIDER_SITE_OTHER): Payer: BC Managed Care – PPO | Admitting: Specialist

## 2020-09-08 VITALS — BP 137/81 | HR 76 | Ht 70.0 in | Wt 290.7 lb

## 2020-09-08 DIAGNOSIS — M4316 Spondylolisthesis, lumbar region: Secondary | ICD-10-CM | POA: Diagnosis not present

## 2020-09-08 DIAGNOSIS — M5136 Other intervertebral disc degeneration, lumbar region: Secondary | ICD-10-CM | POA: Diagnosis not present

## 2020-09-08 DIAGNOSIS — M48061 Spinal stenosis, lumbar region without neurogenic claudication: Secondary | ICD-10-CM

## 2020-09-08 DIAGNOSIS — M47816 Spondylosis without myelopathy or radiculopathy, lumbar region: Secondary | ICD-10-CM | POA: Diagnosis not present

## 2020-09-08 NOTE — Patient Instructions (Signed)
Plan: Avoid bending, stooping and avoid lifting weights greater than 10 lbs. Avoid prolong standing and walking. Avoid frequent bending and stooping  No lifting greater than 10 lbs. May use ice or moist heat for pain. Weight loss is of benefit. Handicap license is approved. Give the recent RFA treatment more time to be of benefit if it does not work then may consider  Further evaluation for fusion or spinal cord stimulator.

## 2020-09-08 NOTE — Progress Notes (Signed)
Office Visit Note   Patient: Allen Henry.           Date of Birth: [birth year]           MRN: [id number] Visit Date: 09/08/2020              Requested by: Marvis Repress, MD No address on file PCP: Marvis Repress, MD   Assessment & Plan: Visit Diagnoses:  1. Spinal stenosis of lumbar region without neurogenic claudication   2. Degenerative disc disease, lumbar     Plan: Avoid bending, stooping and avoid lifting weights greater than 10 lbs. Avoid prolong standing and walking. Avoid frequent bending and stooping  No lifting greater than 10 lbs. May use ice or moist heat for pain. Weight loss is of benefit. Handicap license is approved. Give the recent RFA treatment more time to be of benefit if it does not work then may consider  Further evaluation for fusion or spinal cord stimulator.  Follow-Up Instructions: No follow-ups on file.   Orders:  Orders Placed This Encounter  Procedures  . XR Lumbar Spine 2-3 Views   No orders of the defined types were placed in this encounter.     Procedures: No procedures performed   Clinical Data: No additional findings.   Subjective: Chief Complaint  Patient presents with  . Lower Back - Follow-up    65 year old male with history of previous lumbar decompressive hemilaminectomies for spinal stenosis with persistent back and right thigh pain. Underwent recent right L4-5 and L5-S1 RFA for spondylosis. He reports that it is too early for telling if his pain is better. No bowel and bladder difficulty. He was here 2 weeks ago and was here and missed being seen.   Review of Systems  Constitutional: Negative.   HENT: Negative.   Eyes: Negative.   Respiratory: Negative.   Cardiovascular: Negative.   Gastrointestinal: Negative.   Endocrine: Negative.   Genitourinary: Negative.   Musculoskeletal: Negative.   Skin: Negative.   Allergic/Immunologic: Negative.   Neurological: Negative.   Hematological:  Negative.   Psychiatric/Behavioral: Negative.      Objective: Vital Signs: BP 137/81 (BP Location: Left Arm, Patient Position: Sitting)   Pulse 76   Ht 5\' 10"  (1.778 m)   Wt 290 lb 11.2 oz (131.9 kg)   BMI 41.71 kg/m   Physical Exam Constitutional:      Appearance: He is well-developed.  HENT:     Head: Normocephalic and atraumatic.  Eyes:     Pupils: Pupils are equal, round, and reactive to light.  Pulmonary:     Effort: Pulmonary effort is normal.     Breath sounds: Normal breath sounds.  Abdominal:     General: Bowel sounds are normal.     Palpations: Abdomen is soft.  Musculoskeletal:     Cervical back: Normal range of motion and neck supple.     Lumbar back: Negative right straight leg raise test and negative left straight leg raise test.  Skin:    General: Skin is warm and dry.  Neurological:     Mental Status: He is alert and oriented to person, place, and time.  Psychiatric:        Behavior: Behavior normal.        Thought Content: Thought content normal.        Judgment: Judgment normal.     Back Exam   Tenderness  The patient is experiencing tenderness in the lumbar.  Range of Motion  Extension: abnormal  Flexion: normal  Lateral bend right: normal  Lateral bend left: normal  Rotation right: normal  Rotation left: normal   Muscle Strength  Right Quadriceps:  5/5  Left Quadriceps:  5/5  Right Hamstrings:  5/5  Left Hamstrings:  5/5   Tests  Straight leg raise right: negative Straight leg raise left: negative  Reflexes  Patellar: 0/4 Achilles: 0/4      Specialty Comments:  No specialty comments available.  Imaging: No results found.   PMFS History: Patient Active Problem List   Diagnosis Date Noted  . Spinal stenosis of lumbar region 06/19/2017    Priority: High    Class: Chronic  . Status post lumbar laminectomy 06/19/2017   Past Medical History:  Diagnosis Date  . Arthritis   . Headache   . Pneumonia   . Vertigo       Family History  Problem Relation Age of Onset  . Heart attack Father   . Cancer Father   . Cancer Mother     Past Surgical History:  Procedure Laterality Date  . COLONOSCOPY    . LUMBAR LAMINECTOMY N/A 06/19/2017   Procedure: RIGHT LUMBAR THREE-FOUE AND LUMBAR FIVE-SACRAL ONE  PARTIAL HEMILAMINECTOMY, BILATERAL PARTIAL HEMILAMINECTOMY LUMBAR FOUR-FIVE;  Surgeon: Kerrin Champagne, MD;  Location: [facility] OR;  Service: Orthopedics;  Laterality: N/A;  RIGHT Lumbar three-four AND Lumbar five-Sacrum one  PARTIAL HEMILAMINECTOMY, BILATERAL PARTIAL HEMILAMINECTOMY Lumbar four-five   . TONSILLECTOMY     Social History   Occupational History  . Not on file  Tobacco Use  . Smoking status: Former Smoker    Types: Cigarettes    Quit date: 06/12/2002    Years since quitting: 18.2  . Smokeless tobacco: Never Used  Vaping Use  . Vaping Use: Never used  Substance and Sexual Activity  . Alcohol use: No  . Drug use: No    Types: PCP  . Sexual activity: Not on file

## 2020-10-03 ENCOUNTER — Telehealth: Payer: Self-pay | Admitting: Physical Medicine and Rehabilitation

## 2020-10-03 NOTE — Telephone Encounter (Signed)
Patient called with an upcoming jury duty. Pt is asking for a letter not to attend jury duty. Pt states his back is out and can not attend jury duty. Please call patient when letter is ready for pick up.

## 2020-10-04 NOTE — Telephone Encounter (Signed)
Lmom for pt that we need a copy of the letter to be able to do a note. I advised that he can bring it by here or fax it to [place].

## 2020-10-04 NOTE — Telephone Encounter (Signed)
Please Advise

## 2020-10-05 NOTE — Telephone Encounter (Signed)
Patient is aware that he needs to bring the letter by to our office

## 2020-10-13 ENCOUNTER — Other Ambulatory Visit: Payer: Self-pay

## 2020-10-13 ENCOUNTER — Ambulatory Visit (INDEPENDENT_AMBULATORY_CARE_PROVIDER_SITE_OTHER): Payer: BC Managed Care – PPO | Admitting: Specialist

## 2020-10-13 ENCOUNTER — Encounter: Payer: Self-pay | Admitting: Specialist

## 2020-10-13 VITALS — BP 153/92 | HR 125 | Ht 70.0 in | Wt 290.7 lb

## 2020-10-13 DIAGNOSIS — M5136 Other intervertebral disc degeneration, lumbar region: Secondary | ICD-10-CM | POA: Diagnosis not present

## 2020-10-13 DIAGNOSIS — M160 Bilateral primary osteoarthritis of hip: Secondary | ICD-10-CM

## 2020-10-13 DIAGNOSIS — M4316 Spondylolisthesis, lumbar region: Secondary | ICD-10-CM

## 2020-10-13 DIAGNOSIS — M48061 Spinal stenosis, lumbar region without neurogenic claudication: Secondary | ICD-10-CM | POA: Diagnosis not present

## 2020-10-13 DIAGNOSIS — M47816 Spondylosis without myelopathy or radiculopathy, lumbar region: Secondary | ICD-10-CM

## 2020-10-13 NOTE — Progress Notes (Signed)
Office Visit Note   Patient: Allen Henry.           Date of Birth: [birth year]           MRN: [id number] Visit Date: 10/13/2020              Requested by: Marvis Repress, MD No address on file PCP: Marvis Repress, MD   Assessment & Plan: Visit Diagnoses:  1. Spinal stenosis of lumbar region without neurogenic claudication   2. Degenerative disc disease, lumbar   3. Spondylosis without myelopathy or radiculopathy, lumbar region   4. Bilateral primary osteoarthritis of hip   5. Spondylolisthesis, lumbar region     Plan: Plan: Avoid bending, stooping and avoid lifting weights greater than 10 lbs. Avoid prolong standing and walking. Avoid frequent bending and stooping  No lifting greater than 10 lbs. May use ice or moist heat for pain. Weight loss is of benefit. Handicap license is approved. Give the recent RFA treatment more time to be of benefit if it does not work then may consider  Further evaluation for fusion or spinal cord stimulator.  Follow-Up Instructions: No follow-ups on file.    Follow-Up Instructions: Return in about 6 months (around 04/12/2021).   Orders:  No orders of the defined types were placed in this encounter.  No orders of the defined types were placed in this encounter.     Procedures: No procedures performed   Clinical Data: No additional findings.   Subjective: Chief Complaint  Patient presents with  . Lower Back - Follow-up    65 year old male with history of lumbar laminectomy L3-4 and L5-S1 for spinal stenosis now with persistent back and leg pain. He still experiences difficulty standing and walking. Walking some painful but it is bearable. He is taking methocarbamol and intermittant,tylenol. No bowel or bladder difficulty.  Can not walk one mile. He does grocery shop and helps with church twice a week and is able to walk one side of [employer] to the other.    Review of Systems  Constitutional: Negative.    HENT: Positive for congestion, sinus pressure and sinus pain.   Eyes: Negative.   Respiratory: Positive for wheezing.   Cardiovascular: Negative.   Gastrointestinal: Positive for abdominal pain and diarrhea. Negative for abdominal distention, anal bleeding, blood in stool, constipation, nausea, rectal pain and vomiting.  Endocrine: Negative.  Negative for cold intolerance, heat intolerance and polydipsia.  Genitourinary: Negative.   Musculoskeletal: Positive for back pain and gait problem.  Skin: Negative.   Allergic/Immunologic: Negative.  Negative for environmental allergies, food allergies and immunocompromised state.  Neurological: Negative for dizziness, tremors, seizures, syncope, facial asymmetry, speech difficulty, weakness, light-headedness, numbness and headaches.  Hematological: Negative.  Negative for adenopathy. Does not bruise/bleed easily.  Psychiatric/Behavioral: Negative.  Negative for agitation, behavioral problems, confusion, decreased concentration, dysphoric mood, hallucinations, self-injury and sleep disturbance. The patient is not nervous/anxious and is not hyperactive.      Objective: Vital Signs: BP (!) 153/92 (BP Location: Left Arm, Patient Position: Sitting)   Pulse (!) 125   Ht 5\' 10"  (1.778 m)   Wt 290 lb 11.2 oz (131.9 kg)   BMI 41.71 kg/m   Physical Exam Musculoskeletal:     Lumbar back: Negative right straight leg raise test and negative left straight leg raise test.     Back Exam   Tenderness  The patient is experiencing tenderness in the lumbar.  Range of Motion  Extension: normal  Lateral  bend right: abnormal  Lateral bend left: abnormal  Rotation right: abnormal  Rotation left: abnormal   Muscle Strength  Right Quadriceps:  5/5  Left Quadriceps:  5/5  Right Hamstrings:  5/5  Left Hamstrings:  5/5   Tests  Straight leg raise right: negative Straight leg raise left: negative  Reflexes  Patellar: 2/4 Achilles: 0/4  Other  Toe  walk: normal Heel walk: normal Sensation: normal  Comments:  Right foot with whole foot numbness Right lateral L5 numbness.      Specialty Comments:  No specialty comments available.  Imaging: No results found.   PMFS History: Patient Active Problem List   Diagnosis Date Noted  . Spinal stenosis of lumbar region 06/19/2017    Priority: High    Class: Chronic  . Status post lumbar laminectomy 06/19/2017   Past Medical History:  Diagnosis Date  . Arthritis   . Headache   . Pneumonia   . Vertigo     Family History  Problem Relation Age of Onset  . Heart attack Father   . Cancer Father   . Cancer Mother     Past Surgical History:  Procedure Laterality Date  . COLONOSCOPY    . LUMBAR LAMINECTOMY N/A 06/19/2017   Procedure: RIGHT LUMBAR THREE-FOUE AND LUMBAR FIVE-SACRAL ONE  PARTIAL HEMILAMINECTOMY, BILATERAL PARTIAL HEMILAMINECTOMY LUMBAR FOUR-FIVE;  Surgeon: Kerrin Champagne, MD;  Location: [facility] OR;  Service: Orthopedics;  Laterality: N/A;  RIGHT Lumbar three-four AND Lumbar five-Sacrum one  PARTIAL HEMILAMINECTOMY, BILATERAL PARTIAL HEMILAMINECTOMY Lumbar four-five   . TONSILLECTOMY     Social History   Occupational History  . Not on file  Tobacco Use  . Smoking status: Former Smoker    Types: Cigarettes    Quit date: 06/12/2002    Years since quitting: 18.3  . Smokeless tobacco: Never Used  Vaping Use  . Vaping Use: Never used  Substance and Sexual Activity  . Alcohol use: No  . Drug use: No    Types: PCP  . Sexual activity: Not on file

## 2020-10-13 NOTE — Patient Instructions (Signed)
Plan: Avoid bending, stooping and avoid lifting weights greater than 10 lbs. Avoid prolong standing and walking. Avoid frequent bending and stooping  No lifting greater than 10 lbs. May use ice or moist heat for pain. Weight loss is of benefit. Handicap license is approved. Give the recent RFA treatment more time to be of benefit if it does not work then may consider  Further evaluation for fusion or spinal cord stimulator.  Follow-Up Instructions: No follow-ups on file.

## 2020-11-13 NOTE — Procedures (Signed)
Lumbar Facet Joint Nerve Denervation  Patient: Allen Henry.      Date of Birth: [birth year] MRN: [id number] PCP: Marvis Repress, MD      Visit Date: 08/29/2020   Universal Protocol:    Date/Time: 11/13/2210:43 PM  Consent Given By: the patient  Position: PRONE  Additional Comments: Vital signs were monitored before and after the procedure. Patient was prepped and draped in the usual sterile fashion. The correct patient, procedure, and site was verified.   Injection Procedure Details:   Procedure diagnoses:  1. Spondylosis without myelopathy or radiculopathy, lumbar region      Meds Administered:  Meds ordered this encounter  Medications  . betamethasone acetate-betamethasone sodium phosphate (CELESTONE) injection 12 mg     Laterality: Right  Location/Site:  L4-L5, L3 and L4 medial branches and L5-S1, L4 medial branch and L5 dorsal ramus  Needle: 18 ga.,  62mm active tip RF Cannula  Needle Placement: Along juncture of superior articular process and transverse pocess  Findings:  -Comments:  Procedure Details: For each desired target nerve, the corresponding transverse process (sacral ala for the L5 dorsal rami) was identified and the fluoroscope was positioned to square off the endplates of the corresponding vertebral body to achieve a true AP midline view.  The beam was then obliqued 15 to 20 degrees and caudally tilted 15 to 20 degrees to line up a trajectory along the target nerves. The skin over the target of the junction of superior articulating process and transverse process (sacral ala for the L5 dorsal rami) was infiltrated with 76ml of 1% Lidocaine without Epinephrine.  The 18 gauge 15mm active tip outer cannula was advanced in trajectory view to the target.  This procedure was repeated for each target nerve.  Then, for all levels, the outer cannula placement was fine-tuned and the position was then confirmed with bi-planar imaging.    Test  stimulation was done both at sensory and motor levels to ensure there was no radicular stimulation. The target tissues were then infiltrated with 1 ml of 1% Lidocaine without Epinephrine. Subsequently, a percutaneous neurotomy was carried out for 90 seconds at 80 degrees Celsius.  After the completion of the lesion, 1 ml of injectate was delivered. It was then repeated for each facet joint nerve mentioned above. Appropriate radiographs were obtained to verify the probe placement during the neurotomy.   Additional Comments:  The patient tolerated the procedure well Dressing: 2 x 2 sterile gauze and Band-Aid    Post-procedure details: Patient was observed during the procedure. Post-procedure instructions were reviewed.  Patient left the clinic in stable condition.

## 2020-11-13 NOTE — Progress Notes (Signed)
Allen Henry. - 65 y.o. male MRN [id number]  Date of birth: Aug 18, 1956  Office Visit Note: Visit Date: 08/29/2020 PCP: Marvis Repress, MD Referred by: Marvis Repress, MD  Subjective: Chief Complaint  Patient presents with  . Lower Back - Pain   HPI:  Allen Scally. is a 65 y.o. male who comes in today for planned repeat radiofrequency ablation of the Right L4-L5 and L5-S1  Lumbar facet joints. This would be ablation of the corresponding medial branches and/or dorsal rami.  Patient has had double diagnostic blocks with more than 70% relief.  Subsequent ablation gave them more than 6 months of over 60% relief December 2020.  They have had chronic back pain for quite some time, more than 3 months, which has been an ongoing situation with recalcitrant axial back pain.  They have no radicular pain.  Their axial pain is worse with standing and ambulating and on exam today with facet loading.  They have had physical therapy as well as home exercise program.  The imaging noted in the chart below indicated facet pathology. Accordingly they meet all the criteria and qualification for for radiofrequency ablation and we are going to complete this today hopefully for more longer term relief as part of comprehensive management program.  ROS Otherwise per HPI.  Assessment & Plan: Visit Diagnoses:    ICD-10-CM   1. Spondylosis without myelopathy or radiculopathy, lumbar region  M47.816 XR C-ARM NO REPORT    Radiofrequency,Lumbar    betamethasone acetate-betamethasone sodium phosphate (CELESTONE) injection 12 mg    Plan: No additional findings.   Meds & Orders:  Meds ordered this encounter  Medications  . betamethasone acetate-betamethasone sodium phosphate (CELESTONE) injection 12 mg    Orders Placed This Encounter  Procedures  . Radiofrequency,Lumbar  . XR C-ARM NO REPORT    Follow-up: Return if symptoms worsen or fail to improve.   Procedures: No procedures  performed  Lumbar Facet Joint Nerve Denervation  Patient: Allen Ator.      Date of Birth: June 28, 1956 MRN: [id number] PCP: Marvis Repress, MD      Visit Date: 08/29/2020   Universal Protocol:    Date/Time: 11/13/2210:43 PM  Consent Given By: the patient  Position: PRONE  Additional Comments: Vital signs were monitored before and after the procedure. Patient was prepped and draped in the usual sterile fashion. The correct patient, procedure, and site was verified.   Injection Procedure Details:   Procedure diagnoses:  1. Spondylosis without myelopathy or radiculopathy, lumbar region      Meds Administered:  Meds ordered this encounter  Medications  . betamethasone acetate-betamethasone sodium phosphate (CELESTONE) injection 12 mg     Laterality: Right  Location/Site:  L4-L5, L3 and L4 medial branches and L5-S1, L4 medial branch and L5 dorsal ramus  Needle: 18 ga.,  28mm active tip RF Cannula  Needle Placement: Along juncture of superior articular process and transverse pocess  Findings:  -Comments:  Procedure Details: For each desired target nerve, the corresponding transverse process (sacral ala for the L5 dorsal rami) was identified and the fluoroscope was positioned to square off the endplates of the corresponding vertebral body to achieve a true AP midline view.  The beam was then obliqued 15 to 20 degrees and caudally tilted 15 to 20 degrees to line up a trajectory along the target nerves. The skin over the target of the junction of superior articulating process and transverse process (sacral ala for the L5 dorsal rami)  was infiltrated with 52ml of 1% Lidocaine without Epinephrine.  The 18 gauge 75mm active tip outer cannula was advanced in trajectory view to the target.  This procedure was repeated for each target nerve.  Then, for all levels, the outer cannula placement was fine-tuned and the position was then confirmed with bi-planar imaging.     Test stimulation was done both at sensory and motor levels to ensure there was no radicular stimulation. The target tissues were then infiltrated with 1 ml of 1% Lidocaine without Epinephrine. Subsequently, a percutaneous neurotomy was carried out for 90 seconds at 80 degrees Celsius.  After the completion of the lesion, 1 ml of injectate was delivered. It was then repeated for each facet joint nerve mentioned above. Appropriate radiographs were obtained to verify the probe placement during the neurotomy.   Additional Comments:  The patient tolerated the procedure well Dressing: 2 x 2 sterile gauze and Band-Aid    Post-procedure details: Patient was observed during the procedure. Post-procedure instructions were reviewed.  Patient left the clinic in stable condition.        Clinical History: Acute Interface, Incoming Rad Results - 03/27/2017  9:41 AM EDT TECHNIQUE: Multiplanar, multisequence MR imaging of the lumbar spine was obtained without contrast  COMPARISON: 04/27/2015  INDICATION: Low back pain  FINDINGS:  . The conus medullaris appears normal, terminating at the L1 level. No abnormal signal demonstrated within the visualized distal spinal cord. Cauda equina unremarkable.  . Vertebral body heights maintained. Marrow signal within normal limits for age.  Alignment anatomic.  . The visualized lower thoracic spine demonstrates no disc herniation, significant central canal or foraminal stenosis.  . T12-L1: No disc herniation, significant central canal or foraminal stenosis. Marland Kitchen L1-L2: No disc herniation, significant central canal or foraminal stenosis. Marland Kitchen L2-L3: No disc herniation, significant central canal or foraminal stenosis. Marland Kitchen L3-L4: Moderate right foraminal stenosis secondary to broad-based disc protrusion and facet arthrosis. Marland Kitchen L4-L5: Severe right foraminal stenosis secondary to facet arthrosis and ligamentum flavum thickening as well as a broad-based disc osteophyte  complex. There is also moderate left foraminal stenosis.. Moderate central canal stenosis . L5-S1: Severe right facet arthrosis. Low signal structure in the posterolateral aspect of the right neural foramen may represent extruded disc material or osteophyte with severe crowding of the right neural foramen.  . Soft tissues are grossly unremarkable   IMPRESSION: Severe right facet arthrosis at L5-S1. Low signal structure in the posterolateral aspect of the right neural foramen may represent extruded disc material or osteophyte with severe crowding of the right neural foramen.. Correlate for right L5  radiculopathy.  Severe right and moderate left foraminal stenosis at L4-5. Moderate central canal stenosis at this level similar to the prior.  Moderate right foraminal stenosis at L3-4  Overall minimal change from the prior exam     Objective:  VS:  HT:    WT:   BMI:     BP:(!) 144/93  HR:86bpm  TEMP: ( )  RESP:  Physical Exam Vitals and nursing note reviewed.  Constitutional:      General: He is not in acute distress.    Appearance: Normal appearance. He is obese. He is not ill-appearing.  HENT:     Head: Normocephalic and atraumatic.     Right Ear: External ear normal.     Left Ear: External ear normal.     Nose: No congestion.  Eyes:     Extraocular Movements: Extraocular movements intact.  Cardiovascular:  Rate and Rhythm: Normal rate.     Pulses: Normal pulses.  Pulmonary:     Effort: Pulmonary effort is normal. No respiratory distress.  Abdominal:     General: There is no distension.     Palpations: Abdomen is soft.  Musculoskeletal:        General: No tenderness or signs of injury.     Cervical back: Neck supple.     Right lower leg: No edema.     Left lower leg: No edema.     Comments: Patient has good distal strength without clonus. Patient somewhat slow to rise from a seated position to full extension.  There is concordant low back pain with facet loading and  lumbar spine extension rotation.  There are no definitive trigger points but the patient is somewhat tender across the lower back and PSIS.  There is no pain with hip rotation.   Skin:    Findings: No erythema or rash.  Neurological:     General: No focal deficit present.     Mental Status: He is alert and oriented to person, place, and time.     Sensory: No sensory deficit.     Motor: No weakness or abnormal muscle tone.     Coordination: Coordination normal.  Psychiatric:        Mood and Affect: Mood normal.        Behavior: Behavior normal.      Imaging: No results found.

## 2021-03-17 ENCOUNTER — Other Ambulatory Visit: Payer: Self-pay | Admitting: Specialist

## 2021-04-13 ENCOUNTER — Ambulatory Visit (INDEPENDENT_AMBULATORY_CARE_PROVIDER_SITE_OTHER): Payer: BC Managed Care – PPO | Admitting: Specialist

## 2021-04-13 ENCOUNTER — Other Ambulatory Visit: Payer: Self-pay

## 2021-04-13 ENCOUNTER — Encounter: Payer: Self-pay | Admitting: Specialist

## 2021-04-13 VITALS — BP 130/79 | HR 94 | Ht 70.0 in | Wt 298.8 lb

## 2021-04-13 DIAGNOSIS — M47816 Spondylosis without myelopathy or radiculopathy, lumbar region: Secondary | ICD-10-CM

## 2021-04-13 DIAGNOSIS — M5136 Other intervertebral disc degeneration, lumbar region: Secondary | ICD-10-CM | POA: Diagnosis not present

## 2021-04-13 DIAGNOSIS — M160 Bilateral primary osteoarthritis of hip: Secondary | ICD-10-CM

## 2021-04-13 DIAGNOSIS — M48061 Spinal stenosis, lumbar region without neurogenic claudication: Secondary | ICD-10-CM

## 2021-04-13 DIAGNOSIS — M4316 Spondylolisthesis, lumbar region: Secondary | ICD-10-CM | POA: Diagnosis not present

## 2021-04-13 NOTE — Patient Instructions (Signed)
Plan: Avoid frequent bending and stooping  No lifting greater than 10 lbs. May use ice or moist heat for pain. Weight loss is of benefit. Best medication for lumbar disc disease is arthritis medications but you are unable to take these meds like motrin, celebrex and naprosyn. Don not take them due to their tendency to cause stomach upset.  Exercise is important to improve your indurance and does allow people to function better inspite of back pain.

## 2021-04-13 NOTE — Progress Notes (Signed)
Office Visit Note   Patient: Allen Henry.           Date of Birth: 21-Nov-1955           MRN: [id number] Visit Date: 04/13/2021              Requested by: Marvis Repress, MD No address on file PCP: Marvis Repress, MD   Assessment & Plan: Visit Diagnoses:  1. Degenerative disc disease, lumbar   2. Spondylosis without myelopathy or radiculopathy, lumbar region   3. Spondylolisthesis, lumbar region   4. Bilateral primary osteoarthritis of hip   5. Spinal stenosis of lumbar region without neurogenic claudication     Plan: Avoid frequent bending and stooping  No lifting greater than 10 lbs. May use ice or moist heat for pain. Weight loss is of benefit. Best medication for lumbar disc disease is arthritis medications but you are unable to take these meds like motrin, celebrex and naprosyn. Don not take them due to their tendency to cause stomach upset.  Exercise is important to improve your indurance and does allow people to function better inspite of back pain.    Follow-Up Instructions: No follow-ups on file.   Orders:  No orders of the defined types were placed in this encounter.  No orders of the defined types were placed in this encounter.     Procedures: No procedures performed   Clinical Data: No additional findings.   Subjective: Chief Complaint  Patient presents with   Lower Back - Follow-up    Today it is doing pretty good. States that his wife had him cleaning and moving things the other day and he was hurting after that.     65 year old male with history of L4-5 bilateral lateral recess decompression for lateral recess stenosis. He had an episode of pain and buttock pain about 2 weeks abo and is starting to feel better. No bowel or bladder discomfort. Had COVID about 2 weeks ago Tues. He tested positive with testing at home and clinic. Pain the back was severe and he had difficulty with anything. She reports that his wife was promoted  and is training people.   Review of Systems  Constitutional: Negative.   HENT: Negative.    Eyes: Negative.   Respiratory: Negative.    Cardiovascular: Negative.   Gastrointestinal: Negative.   Endocrine: Negative.   Genitourinary: Negative.   Musculoskeletal: Negative.   Skin: Negative.   Allergic/Immunologic: Negative.   Neurological: Negative.   Hematological: Negative.   Psychiatric/Behavioral: Negative.      Objective: Vital Signs: BP 130/79 (BP Location: Left Arm, Patient Position: Sitting)   Pulse 94   Ht 5\' 10"  (1.778 m)   Wt 298 lb 12.8 oz (135.5 kg)   BMI 42.87 kg/m   Physical Exam Constitutional:      Appearance: He is well-developed.  HENT:     Head: Normocephalic and atraumatic.  Eyes:     Pupils: Pupils are equal, round, and reactive to light.  Pulmonary:     Effort: Pulmonary effort is normal.     Breath sounds: Normal breath sounds.  Abdominal:     General: Bowel sounds are normal.     Palpations: Abdomen is soft.  Musculoskeletal:     Cervical back: Normal range of motion and neck supple.     Lumbar back: Negative right straight leg raise test and negative left straight leg raise test.  Skin:    General: Skin is warm and  dry.  Neurological:     Mental Status: He is alert and oriented to person, place, and time.  Psychiatric:        Behavior: Behavior normal.        Thought Content: Thought content normal.        Judgment: Judgment normal.   Back Exam   Tenderness  The patient is experiencing tenderness in the lumbar.  Range of Motion  Extension:  abnormal  Flexion:  normal  Lateral bend right:  normal  Lateral bend left:  normal  Rotation left:  normal   Muscle Strength  Right Quadriceps:  5/5  Left Quadriceps:  5/5  Right Hamstrings:  5/5  Left Hamstrings:  5/5   Tests  Straight leg raise right: negative Straight leg raise left: negative  Reflexes  Patellar:  0/4 Achilles:  0/4  Other  Toe walk: normal Heel walk:  normal  Comments:  Ble to reach toes and shoes and socks.     Specialty Comments:  No specialty comments available.  Imaging: No results found.   PMFS History: Patient Active Problem List   Diagnosis Date Noted   Spinal stenosis of lumbar region 06/19/2017    Priority: High    Class: Chronic   Status post lumbar laminectomy 06/19/2017   Past Medical History:  Diagnosis Date   Arthritis    Headache    Pneumonia    Vertigo     Family History  Problem Relation Age of Onset   Heart attack Father    Cancer Father    Cancer Mother     Past Surgical History:  Procedure Laterality Date   COLONOSCOPY     LUMBAR LAMINECTOMY N/A 06/19/2017   Procedure: RIGHT LUMBAR THREE-FOUE AND LUMBAR FIVE-SACRAL ONE  PARTIAL HEMILAMINECTOMY, BILATERAL PARTIAL HEMILAMINECTOMY LUMBAR FOUR-FIVE;  Surgeon: Kerrin Champagne, MD;  Location: [facility] OR;  Service: Orthopedics;  Laterality: N/A;  RIGHT Lumbar three-four AND Lumbar five-Sacrum one  PARTIAL HEMILAMINECTOMY, BILATERAL PARTIAL HEMILAMINECTOMY Lumbar four-five    TONSILLECTOMY     Social History   Occupational History   Not on file  Tobacco Use   Smoking status: Former    Pack years: 0.00    Types: Cigarettes    Quit date: 06/12/2002    Years since quitting: 18.8   Smokeless tobacco: Never  Vaping Use   Vaping Use: Never used  Substance and Sexual Activity   Alcohol use: No   Drug use: No    Types: PCP   Sexual activity: Not on file

## 2021-06-08 ENCOUNTER — Other Ambulatory Visit: Payer: Self-pay | Admitting: Surgery

## 2021-07-13 ENCOUNTER — Encounter: Payer: Self-pay | Admitting: Surgery

## 2021-07-13 ENCOUNTER — Other Ambulatory Visit: Payer: Self-pay

## 2021-07-13 ENCOUNTER — Ambulatory Visit (INDEPENDENT_AMBULATORY_CARE_PROVIDER_SITE_OTHER): Payer: BC Managed Care – PPO | Admitting: Surgery

## 2021-07-13 DIAGNOSIS — M48061 Spinal stenosis, lumbar region without neurogenic claudication: Secondary | ICD-10-CM

## 2021-07-13 DIAGNOSIS — M5136 Other intervertebral disc degeneration, lumbar region: Secondary | ICD-10-CM | POA: Diagnosis not present

## 2021-07-13 DIAGNOSIS — M4316 Spondylolisthesis, lumbar region: Secondary | ICD-10-CM

## 2021-07-13 NOTE — Progress Notes (Signed)
Office Visit Note   Patient: Allen Henry.           Date of Birth: 01-01-1956           MRN: [id number] Visit Date: 07/13/2021              Requested by: Marvis Repress, MD No address on file PCP: Marvis Repress, MD   Assessment & Plan: Visit Diagnoses:  1. Degenerative disc disease, lumbar   2. Spinal stenosis of lumbar region without neurogenic claudication     Plan: Since patient continues have ongoing pain and lower extremity radiculopathy that is failed conservative treatment I will go ahead and schedule lumbar MRI with and without contrast.  Patient will follow-up in 3 weeks with Dr. Otelia Sergeant to discuss results and further treatment options.  All questions answered.  Follow-Up Instructions: Return in about 3 weeks (around 08/03/2021) for with dr Otelia Sergeant to review lumbar MRI.   Orders:  No orders of the defined types were placed in this encounter.  No orders of the defined types were placed in this encounter.     Procedures: No procedures performed   Clinical Data: No additional findings.   Subjective: Chief Complaint  Patient presents with   Lower Back - Pain    HPI 65 year old white male who is status post L3-S1 decompression 2018 returns with complaints of chronic low back pain and lower extremity radiculopathy.  Patient last seen by Dr. Otelia Sergeant July 2022.  States that he continues have ongoing symptoms.  Pain when he is sitting, bending, twisting, ambulating.  Patient has not had a lumbar MRI since his surgery. Review of Systems No current cardiac pulmonary GI GU issues  Objective: Vital Signs: There were no vitals taken for this visit.  Physical Exam Constitutional:      Appearance: He is obese.  Eyes:     Extraocular Movements: Extraocular movements intact.  Musculoskeletal:     Comments: Gait is somewhat antalgic.  Bilateral lumbar paraspinal tenderness.  Negative log roll bilateral hips.  Negative straight leg raise.  No focal  motor deficits.  Neurological:     Mental Status: He is alert.  Psychiatric:        Mood and Affect: Mood normal.    Ortho Exam  Specialty Comments:  No specialty comments available.  Imaging: No results found.   PMFS History: Patient Active Problem List   Diagnosis Date Noted   Spinal stenosis of lumbar region 06/19/2017    Class: Chronic   Status post lumbar laminectomy 06/19/2017   Past Medical History:  Diagnosis Date   Arthritis    Headache    Pneumonia    Vertigo     Family History  Problem Relation Age of Onset   Heart attack Father    Cancer Father    Cancer Mother     Past Surgical History:  Procedure Laterality Date   COLONOSCOPY     LUMBAR LAMINECTOMY N/A 06/19/2017   Procedure: RIGHT LUMBAR THREE-FOUE AND LUMBAR FIVE-SACRAL ONE  PARTIAL HEMILAMINECTOMY, BILATERAL PARTIAL HEMILAMINECTOMY LUMBAR FOUR-FIVE;  Surgeon: Kerrin Champagne, MD;  Location: [facility] OR;  Service: Orthopedics;  Laterality: N/A;  RIGHT Lumbar three-four AND Lumbar five-Sacrum one  PARTIAL HEMILAMINECTOMY, BILATERAL PARTIAL HEMILAMINECTOMY Lumbar four-five    TONSILLECTOMY     Social History   Occupational History   Not on file  Tobacco Use   Smoking status: Former    Types: Cigarettes    Quit date: 06/12/2002    Years  since quitting: 19.0   Smokeless tobacco: Never  Vaping Use   Vaping Use: Never used  Substance and Sexual Activity   Alcohol use: No   Drug use: No    Types: PCP   Sexual activity: Not on file

## 2021-08-01 ENCOUNTER — Other Ambulatory Visit: Payer: BC Managed Care – PPO

## 2021-08-04 ENCOUNTER — Ambulatory Visit
Admission: RE | Admit: 2021-08-04 | Discharge: 2021-08-04 | Disposition: A | Discharge status: Home / Self Care | Payer: BC Managed Care – PPO | Source: Ambulatory Visit | Attending: Surgery | Admitting: Surgery

## 2021-08-04 ENCOUNTER — Other Ambulatory Visit: Payer: Self-pay

## 2021-08-04 DIAGNOSIS — M4316 Spondylolisthesis, lumbar region: Secondary | ICD-10-CM

## 2021-08-04 MED ORDER — GADOBENATE DIMEGLUMINE 529 MG/ML IV SOLN
20.0000 mL | Freq: Once | INTRAVENOUS | Status: AC | PRN
  Administered 2021-08-04: 20 mL via INTRAVENOUS

## 2021-08-11 ENCOUNTER — Other Ambulatory Visit: Payer: Self-pay

## 2021-08-11 ENCOUNTER — Encounter: Payer: Self-pay | Admitting: Specialist

## 2021-08-11 ENCOUNTER — Ambulatory Visit (INDEPENDENT_AMBULATORY_CARE_PROVIDER_SITE_OTHER): Payer: BC Managed Care – PPO | Admitting: Specialist

## 2021-08-11 VITALS — BP 124/80 | HR 80 | Ht 70.0 in | Wt 299.0 lb

## 2021-08-11 DIAGNOSIS — M47816 Spondylosis without myelopathy or radiculopathy, lumbar region: Secondary | ICD-10-CM | POA: Diagnosis not present

## 2021-08-11 DIAGNOSIS — M5136 Other intervertebral disc degeneration, lumbar region: Secondary | ICD-10-CM

## 2021-08-11 DIAGNOSIS — M4316 Spondylolisthesis, lumbar region: Secondary | ICD-10-CM

## 2021-08-11 DIAGNOSIS — M48061 Spinal stenosis, lumbar region without neurogenic claudication: Secondary | ICD-10-CM

## 2021-08-11 NOTE — Progress Notes (Signed)
   Office Visit Note   Patient: Allen Henry.           Date of Birth: 1956-06-14           MRN: [id number] Visit Date: 08/11/2021              Requested by: Marvis Repress, MD No address on file PCP: Marvis Repress, MD   Assessment & Plan: Visit Diagnoses:  1. Degenerative disc disease, lumbar   2. Spondylolisthesis, lumbar region   3. Spinal stenosis of lumbar region without neurogenic claudication   4. Spondylosis without myelopathy or radiculopathy, lumbar region     Plan: Avoid bending, stooping and avoid lifting weights greater than 10 lbs. Avoid prolong standing and walking. Avoid frequent bending and stooping  No lifting greater than 10 lbs. May use ice or moist heat for pain. Weight loss is of benefit. Handicap license is approved. Physical therapy for conservative management and if no improvement will consider lumbar fusion and decompression.   Follow-Up Instructions: Return in about 3 weeks (around 09/01/2021).   Orders:  No orders of the defined types were placed in this encounter.  No orders of the defined types were placed in this encounter.     Procedures: No procedures performed   Clinical Data: No additional findings.   Subjective: Chief Complaint  Patient presents with   Lower Back - Follow-up    65 year old male with history of lumbar decompressive laminectomies for lateral recess stenosis. Now with increased Pain that is mainly back with some radiation upwards to the left scapula. Pain can be severe and takes meloxicam and gabapentin for pain.   Review of Systems   Objective: Vital Signs: BP 124/80   Pulse 80   Ht 5\' 10"  (1.778 m)   Wt 299 lb (135.6 kg)   BMI 42.90 kg/m   Physical Exam  Ortho Exam  Specialty Comments:  No specialty comments available.  Imaging: No results found.   PMFS History: Patient Active Problem List   Diagnosis Date Noted   Spinal stenosis of lumbar region 06/19/2017     Priority: High    Class: Chronic   Status post lumbar laminectomy 06/19/2017   Past Medical History:  Diagnosis Date   Arthritis    Headache    Pneumonia    Vertigo     Family History  Problem Relation Age of Onset   Heart attack Father    Cancer Father    Cancer Mother     Past Surgical History:  Procedure Laterality Date   COLONOSCOPY     LUMBAR LAMINECTOMY N/A 06/19/2017   Procedure: RIGHT LUMBAR THREE-FOUE AND LUMBAR FIVE-SACRAL ONE  PARTIAL HEMILAMINECTOMY, BILATERAL PARTIAL HEMILAMINECTOMY LUMBAR FOUR-FIVE;  Surgeon: 08/19/2017, MD;  Location: [facility] OR;  Service: Orthopedics;  Laterality: N/A;  RIGHT Lumbar three-four AND Lumbar five-Sacrum one  PARTIAL HEMILAMINECTOMY, BILATERAL PARTIAL HEMILAMINECTOMY Lumbar four-five    TONSILLECTOMY     Social History   Occupational History   Not on file  Tobacco Use   Smoking status: Former    Types: Cigarettes    Quit date: 06/12/2002    Years since quitting: 19.1   Smokeless tobacco: Never  Vaping Use   Vaping Use: Never used  Substance and Sexual Activity   Alcohol use: No   Drug use: No    Types: PCP   Sexual activity: Not on file

## 2021-08-11 NOTE — Patient Instructions (Signed)
Avoid bending, stooping and avoid lifting weights greater than 10 lbs. Avoid prolong standing and walking. Avoid frequent bending and stooping  No lifting greater than 10 lbs. May use ice or moist heat for pain. Weight loss is of benefit. Handicap license is approved. Physical therapy for conservative management and if no improvement will consider lumbar fusion and decompression.

## 2021-08-18 ENCOUNTER — Ambulatory Visit: Payer: BC Managed Care – PPO | Admitting: Physical Therapy

## 2021-08-28 ENCOUNTER — Other Ambulatory Visit: Payer: Self-pay

## 2021-08-28 ENCOUNTER — Ambulatory Visit (INDEPENDENT_AMBULATORY_CARE_PROVIDER_SITE_OTHER): Payer: BC Managed Care – PPO | Admitting: Physical Therapy

## 2021-08-28 DIAGNOSIS — R262 Difficulty in walking, not elsewhere classified: Secondary | ICD-10-CM

## 2021-08-28 DIAGNOSIS — M6281 Muscle weakness (generalized): Secondary | ICD-10-CM | POA: Diagnosis not present

## 2021-08-28 DIAGNOSIS — M5441 Lumbago with sciatica, right side: Secondary | ICD-10-CM

## 2021-08-28 DIAGNOSIS — G8929 Other chronic pain: Secondary | ICD-10-CM

## 2021-08-28 DIAGNOSIS — R2689 Other abnormalities of gait and mobility: Secondary | ICD-10-CM

## 2021-08-28 DIAGNOSIS — R293 Abnormal posture: Secondary | ICD-10-CM | POA: Diagnosis not present

## 2021-08-28 NOTE — Patient Instructions (Signed)
Access Code: [email or web address] URL: [email or web address] Date: 08/28/2021 Prepared by: Vernon Prey April Kirstie Peri  Exercises Clamshell - 1 x daily - 7 x weekly - 2 sets - 10 reps Prone Hip Extension with Bent Knee - 1 x daily - 7 x weekly - 2 sets - 10 reps Modified Thomas Stretch - 1 x daily - 7 x weekly - 2 sets - 30 sec hold

## 2021-08-28 NOTE — Therapy (Signed)
[facility] [place] [address] [place], [place], [address] Phone: [phone]   Fax:  [phone]  Physical Therapy Evaluation  Patient Details  Name: Allen Henry. MRN: [id number] Date of Birth: [birth year] Referring Provider (PT): Kerrin Champagne, MD   Encounter Date: 08/28/2021   PT End of Session - 08/28/21 1433     Visit Number 1    Number of Visits 12    Date for PT Re-Evaluation 10/09/21    Authorization Type BCBS    PT Start Time 1350    PT Stop Time 1430    PT Time Calculation (min) 40 min    Activity Tolerance Patient tolerated treatment well    Behavior During Therapy WFL for tasks assessed/performed             Past Medical History:  Diagnosis Date   Arthritis    Headache    Pneumonia    Vertigo     Past Surgical History:  Procedure Laterality Date   COLONOSCOPY     LUMBAR LAMINECTOMY N/A 06/19/2017   Procedure: RIGHT LUMBAR THREE-FOUE AND LUMBAR FIVE-SACRAL ONE  PARTIAL HEMILAMINECTOMY, BILATERAL PARTIAL HEMILAMINECTOMY LUMBAR FOUR-FIVE;  Surgeon: Kerrin Champagne, MD;  Location: [facility] OR;  Service: Orthopedics;  Laterality: N/A;  RIGHT Lumbar three-four AND Lumbar five-Sacrum one  PARTIAL HEMILAMINECTOMY, BILATERAL PARTIAL HEMILAMINECTOMY Lumbar four-five    TONSILLECTOMY      There were no vitals filed for this visit.    Subjective Assessment - 08/28/21 1352     Subjective Pt reports interest in another back surgery. "X-ray you're standing and showing compression. In MRI you're laying down and shows no pressure." Pt states back pain has been ongoing for a long time ~12 years after falling in the shower. Pt states back surgery was fair for a while but began to hurt again. Pt states he has had to lift, bend, and twist to help his wife with things at home. "My right foot gets numb a lot (top and bottom)". "Easier to go down the steps to go up."    Patient is accompained by: Family member     Pertinent History Prior back surgery in 2018    How long can you sit comfortably? Sometimes 5-10 min; sometimes a couple of hours    How long can you stand comfortably? minutes or hours    How long can you walk comfortably? can get around grocery store bending over the cart despite foot numbness    Diagnostic tests MRI 10/28: L3-4: Previous right hemilaminectomy. Mild right foraminal narrowing due to encroachment by osteophytes but without definite compression of the exiting L3 nerve.  L4-5: Previous posterior decompression. Bilateral facet arthropathy  with 1 mm of anterolisthesis. Bulging of the disc. Bilateral  foraminal narrowing right more than left potential to affect the  exiting L4 nerves.  L5-S1: Previous posterior decompression. Sufficient patency of the  canal and foramina.    Patient Stated Goals Decrease pain and numbness    Currently in Pain? Yes    Pain Score 4     Pain Location Back    Pain Orientation Mid;Right;Left    Pain Descriptors / Indicators Aching;Sore;Tender    Pain Type Chronic pain    Pain Radiating Towards "Middle and into the side -- primarily right side"    Pain Onset More than a month ago    Pain Frequency Constant    Aggravating Factors  Has not noticed in particular "I can do nothing  and it can hurt"                [facility] PT Assessment - 08/28/21 0001       Assessment   Medical Diagnosis M51.36 (ICD-10-CM) - Degenerative disc disease, lumbar  M43.16 (ICD-10-CM) - Spondylolisthesis, lumbar region  M48.061 (ICD-10-CM) - Spinal stenosis of lumbar region without neurogenic claudication  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, lumbar region    Referring Provider (PT) Kerrin Champagne, MD    Onset Date/Surgical Date --   Plans for surgery next year   Prior Therapy 4 years ago after back surgery (nerve decompression?)      Precautions   Precautions Back      Restrictions   Weight Bearing Restrictions No      Balance Screen    Has the patient fallen in the past 6 months No      Home Environment   Living Environment Private residence    Living Arrangements Spouse/significant other    Available Help at Discharge Family    Type of Home House    Home Access Stairs to enter    Entrance Stairs-Number of Steps 8-12 steps   side porch   Home Layout Laundry or work area in basement    Alternate Level Stairs-Number of Steps 7-8    Home Equipment None      Prior Function   Level of Independence Independent    Vocation Retired    Gaffer Currently doing all house work since wife works full time; bends to do Librarian, academic      Observation/Other Assessments   Focus on Therapeutic Outcomes (FOTO)  57 (Risk adjusted 44); predicted 60      Sensation   Light Touch --   Reports R foot numbness on the top and bottom     Posture/Postural Control   Posture/Postural Control Postural limitations    Postural Limitations Rounded Shoulders;Decreased lumbar lordosis;Anterior pelvic tilt;Flexed trunk;Right pelvic obliquity      ROM / Strength   AROM / PROM / Strength Strength;AROM      AROM   AROM Assessment Site Lumbar    Lumbar Flexion ~1" from the floor but minimal lumbar flexion; mostly from hips    Lumbar Extension 50%   painful   Lumbar - Right Side Bend ~1" past knee joint    Lumbar - Left Side Bend to knee joint   painful and feels pull   Lumbar - Right Rotation 80%    Lumbar - Left Rotation 90%      Strength   Overall Strength Comments unable to extend legs from knees bent without low back compensation    Strength Assessment Site Hip;Knee    Right/Left Hip Right;Left    Right Hip Flexion 3+/5    Right Hip Extension 3/5    Right Hip External Rotation  3+/5    Right Hip Internal Rotation 3+/5    Right Hip ABduction 3/5    Left Hip Flexion 3+/5    Left Hip Extension 3/5    Left Hip External Rotation 3+/5    Left Hip Internal Rotation 3+/5    Left Hip ABduction 3-/5    Right/Left Knee Right;Left     Right Knee Flexion 4/5    Right Knee Extension 4+/5    Left Knee Flexion 4-/5    Left Knee Extension 4+/5      Flexibility   Soft Tissue Assessment /Muscle Length yes   tight hip flexors bilat  Hamstrings WFL bilat    Quadriceps WFL bilat      Palpation   Spinal mobility grossly hypomobile throughout    Palpation comment TTP QL, lumbar paraspinals      Special Tests    Special Tests Lumbar    Lumbar Tests FABER test;Prone Knee Bend Test;Straight Leg Raise      FABER test   findings Negative    Comment reports stretch in hip bilat      Prone Knee Bend Test   Findings Negative      Straight Leg Raise   Findings Negative      Transfers   Five time sit to stand comments  9 sec      Ambulation/Gait   Ambulation Distance (Feet) 80 Feet    Assistive device None    Gait Pattern Step-through pattern;Decreased step length - left;Decreased stance time - right;Decreased stride length;Decreased weight shift to right;Trendelenburg;Lateral trunk lean to right;Trunk flexed    Ambulation Surface Level;Indoor                        Objective measurements completed on examination: See above findings.                PT Education - 08/28/21 1615     Education Details Exam findings, POC, HEP    Person(s) Educated Patient    Methods Explanation;Demonstration;Tactile cues;Verbal cues;Handout    Comprehension Verbalized understanding;Returned demonstration;Verbal cues required;Tactile cues required                 PT Long Term Goals - 08/28/21 1625       PT LONG TERM GOAL #1   Title Pt will be independent with his HEP    Time 6    Period Weeks    Status New    Target Date 10/09/21      PT LONG TERM GOAL #2   Title Pt will report at least 50% reduction in his LBP    Time 6    Period Weeks    Status New    Target Date 10/09/21      PT LONG TERM GOAL #3   Title Pt will be able to lift at least 25 lbs for home tasks (i.e. laundry)  utilizing proper form to protect his back    Time 6    Period Weeks    Status New    Target Date 10/09/21      PT LONG TERM GOAL #4   Title Pt will demo improved core strength by maintaining posterior pelvic tilt during Bilateral Bent Leg Lift    Baseline Unable to perform this session    Time 6    Period Weeks    Status New    Target Date 10/09/21      PT LONG TERM GOAL #5   Title Pt will have improved FOTO score to at least 60    Baseline 57    Time 6    Period Weeks    Status New    Target Date 10/09/21                    Plan - 08/28/21 1615     Clinical Impression Statement Mr. Rudell Cobb" Achterberg is a 65 y/o M presenting to OPPT for chronic LBP with R LE numbness. PT assessment is significant for bilat hip and core weakness, abnormal posture, with decreased knowledge of proper body mechanics to reduce load on his  back. Pt would highly benefit from PT to address these benefits to improve pt's overall home and community mobility and performance of ADLs.    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;Bend    [employer];Occupation;Shop;Yard Work;Laundry;Community Activity;Cleaning;Meal Prep    Stability/Clinical Decision Making Evolving/Moderate complexity    Clinical Decision Making Moderate    Rehab Potential Good    PT Frequency 2x / week    PT Duration 6 weeks    PT Treatment/Interventions ADLs/Self Care Home Management;Aquatic Therapy;Cryotherapy;Electrical Stimulation;Iontophoresis 4mg /ml Dexamethasone;Moist Heat;Traction;DME Instruction;Gait training;Stair training;Functional mobility training;Therapeutic activities;Therapeutic exercise;Balance training;Neuromuscular re-education;Patient/family education;Manual techniques;Passive range of motion;Dry needling;Taping    PT  Next Visit Plan Manual therapy and stretch psoas/hip flexors, QL, and lumbar paraspinals. Check pelvic alignment and address as indicated. Continue hip and core strengthening.    PT Home Exercise Plan Access Code [email or web address]    Consulted and Agree with Plan of Care Patient             Patient will benefit from skilled therapeutic intervention in order to improve the following deficits and impairments:  Abnormal gait, Decreased range of motion, Difficulty walking, Increased fascial restricitons, Decreased endurance, Obesity, Pain, Decreased activity tolerance, Hypomobility, Improper body mechanics, Decreased mobility, Decreased strength, Impaired sensation, Postural dysfunction  Visit Diagnosis: Abnormal posture  Difficulty in walking, not elsewhere classified  Localized edema  Muscle weakness (generalized)  Other abnormalities of gait and mobility     Problem List Patient Active Problem List   Diagnosis Date Noted   Spinal stenosis of lumbar region 06/19/2017    Class: Chronic   Status post lumbar laminectomy 06/19/2017    [facility] April May, PT, DPT 08/28/2021, 4:31 PM  [facility] [address] [place] [address] [place], [place], [place] Phone: [phone]   Fax:  [phone]  Name: Allen Henry. MRN: Loretha Brasil Date of Birth: [birth year]

## 2021-09-04 ENCOUNTER — Other Ambulatory Visit: Payer: Self-pay

## 2021-09-04 ENCOUNTER — Ambulatory Visit (INDEPENDENT_AMBULATORY_CARE_PROVIDER_SITE_OTHER): Payer: BC Managed Care – PPO | Admitting: Physical Therapy

## 2021-09-04 DIAGNOSIS — R262 Difficulty in walking, not elsewhere classified: Secondary | ICD-10-CM

## 2021-09-04 DIAGNOSIS — M6281 Muscle weakness (generalized): Secondary | ICD-10-CM | POA: Diagnosis not present

## 2021-09-04 DIAGNOSIS — R293 Abnormal posture: Secondary | ICD-10-CM | POA: Diagnosis not present

## 2021-09-04 DIAGNOSIS — M5441 Lumbago with sciatica, right side: Secondary | ICD-10-CM

## 2021-09-04 DIAGNOSIS — G8929 Other chronic pain: Secondary | ICD-10-CM

## 2021-09-04 DIAGNOSIS — R2689 Other abnormalities of gait and mobility: Secondary | ICD-10-CM

## 2021-09-04 NOTE — Therapy (Signed)
[facility] [place] [address] [place], [place], [address] Phone: [phone]   Fax:  [phone]  Physical Therapy Treatment  Patient Details  Name: Allen Henry. MRN: [id number] Date of Birth: Jul 27, 1956 Referring Provider (PT): Kerrin Champagne, MD   Encounter Date: 09/04/2021   PT End of Session - 09/04/21 1349     Visit Number 2    Number of Visits 12    Date for PT Re-Evaluation 10/09/21    Authorization Type BCBS    PT Start Time 1346    PT Stop Time 1430    PT Time Calculation (min) 44 min    Activity Tolerance Patient tolerated treatment well    Behavior During Therapy WFL for tasks assessed/performed             Past Medical History:  Diagnosis Date   Arthritis    Headache    Pneumonia    Vertigo     Past Surgical History:  Procedure Laterality Date   COLONOSCOPY     LUMBAR LAMINECTOMY N/A 06/19/2017   Procedure: RIGHT LUMBAR THREE-FOUE AND LUMBAR FIVE-SACRAL ONE  PARTIAL HEMILAMINECTOMY, BILATERAL PARTIAL HEMILAMINECTOMY LUMBAR FOUR-FIVE;  Surgeon: Kerrin Champagne, MD;  Location: [facility] OR;  Service: Orthopedics;  Laterality: N/A;  RIGHT Lumbar three-four AND Lumbar five-Sacrum one  PARTIAL HEMILAMINECTOMY, BILATERAL PARTIAL HEMILAMINECTOMY Lumbar four-five    TONSILLECTOMY      There were no vitals filed for this visit.   Subjective Assessment - 09/04/21 1351     Subjective Pt states he had a rough week -- he had to help his neighbor go to the hospital. Pt also notes that him and his wife went away to [place] for a few days and did not have a lot of time to perform all of his exercises. Pt also notes popping his R knee while doing stairs.    Patient is accompained by: Family member    Pertinent History Prior back surgery in 2018    How long can you sit comfortably? Sometimes 5-10 min; sometimes a couple of hours    How long can you stand comfortably? minutes or hours    How long can you  walk comfortably? can get around grocery store bending over the cart despite foot numbness    Diagnostic tests MRI 10/28: L3-4: Previous right hemilaminectomy. Mild right foraminal narrowing due to encroachment by osteophytes but without definite compression of the exiting L3 nerve.  L4-5: Previous posterior decompression. Bilateral facet arthropathy  with 1 mm of anterolisthesis. Bulging of the disc. Bilateral  foraminal narrowing right more than left potential to affect the  exiting L4 nerves.  L5-S1: Previous posterior decompression. Sufficient patency of the  canal and foramina.    Patient Stated Goals Decrease pain and numbness    Currently in Pain? Yes    Pain Score 6     Pain Location Knee    Pain Orientation Left    Pain Onset More than a month ago    Multiple Pain Sites Yes    Pain Score 6    Pain Location Back                [facility] PT Assessment - 09/04/21 0001       Assessment   Medical Diagnosis M51.36 (ICD-10-CM) - Degenerative disc disease, lumbar  M43.16 (ICD-10-CM) - Spondylolisthesis, lumbar region  M48.061 (ICD-10-CM) - Spinal stenosis of lumbar region without neurogenic claudication  M47.816 (ICD-10-CM) - Spondylosis without myelopathy or  radiculopathy, lumbar region    Referring Provider (PT) Kerrin Champagne, MD                           [facility] Adult PT Treatment/Exercise - 09/04/21 0001       Lumbar Exercises: Stretches   Prone on Elbows Stretch 60 seconds    Press Ups 10 reps      Lumbar Exercises: Standing   Row Strengthening;20 reps;Theraband    Theraband Level (Row) Level 3 (Green)    Shoulder Extension Strengthening;20 reps;Theraband;Both    Theraband Level (Shoulder Extension) Level 3 (Green)      Lumbar Exercises: Supine   Ab Set 10 reps    Pelvic Tilt 10 reps    Bridge 20 reps      Lumbar Exercises: Sidelying   Clam Right;Left;20 reps    Hip Abduction Right;Left;20 reps   unable on L due to pt's knee pain     Manual Therapy    Manual Therapy Joint mobilization    Manual therapy comments STM & TPR R QL, thoracic & lumbar paraspinals    Joint Mobilization unilateral PA along R thoracic & lumbar                          PT Long Term Goals - 08/28/21 1625       PT LONG TERM GOAL #1   Title Pt will be independent with his HEP    Time 6    Period Weeks    Status New    Target Date 10/09/21      PT LONG TERM GOAL #2   Title Pt will report at least 50% reduction in his LBP    Time 6    Period Weeks    Status New    Target Date 10/09/21      PT LONG TERM GOAL #3   Title Pt will be able to lift at least 25 lbs for home tasks (i.e. laundry) utilizing proper form to protect his back    Time 6    Period Weeks    Status New    Target Date 10/09/21      PT LONG TERM GOAL #4   Title Pt will demo improved core strength by maintaining posterior pelvic tilt during Bilateral Bent Leg Lift    Baseline Unable to perform this session    Time 6    Period Weeks    Status New    Target Date 10/09/21      PT LONG TERM GOAL #5   Title Pt will have improved FOTO score to at least 60    Baseline 57    Time 6    Period Weeks    Status New    Target Date 10/09/21                   Plan - 09/04/21 1416     Clinical Impression Statement Treatment focused on initiating core stabilization and continuing LE strengthening. Pt limited due to L knee pain today. Pt's point of back pain lays between thoracic & lumbar -- provided gentle manual therapy, PA mobs and midback strengthening.    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;Bend    [employer];Occupation;Shop;Yard Work;Laundry;Community Activity;Cleaning;Meal Prep    Stability/Clinical Decision Making Evolving/Moderate complexity    Rehab Potential  Good  PT Frequency 2x / week    PT Duration 6 weeks    PT Treatment/Interventions ADLs/Self Care Home Management;Aquatic Therapy;Cryotherapy;Electrical Stimulation;Iontophoresis 4mg /ml Dexamethasone;Moist Heat;Traction;DME Instruction;Gait training;Stair training;Functional mobility training;Therapeutic activities;Therapeutic exercise;Balance training;Neuromuscular re-education;Patient/family education;Manual techniques;Passive range of motion;Dry needling;Taping    PT Next Visit Plan Manual therapy and stretch psoas/hip flexors, QL, and lumbar paraspinals. Check pelvic alignment and address as indicated. Continue hip and core strengthening.    PT Home Exercise Plan Access Code [email or web address]    Consulted and Agree with Plan of Care Patient             Patient will benefit from skilled therapeutic intervention in order to improve the following deficits and impairments:  Abnormal gait, Decreased range of motion, Difficulty walking, Increased fascial restricitons, Decreased endurance, Obesity, Pain, Decreased activity tolerance, Hypomobility, Improper body mechanics, Decreased mobility, Decreased strength, Impaired sensation, Postural dysfunction  Visit Diagnosis: Muscle weakness (generalized)  Chronic bilateral low back pain with right-sided sciatica  Abnormal posture  Difficulty in walking, not elsewhere classified  Other abnormalities of gait and mobility     Problem List Patient Active Problem List   Diagnosis Date Noted   Spinal stenosis of lumbar region 06/19/2017    Class: Chronic   Status post lumbar laminectomy 06/19/2017    [facility] April May, PT, DPT 09/04/2021, 3:24 PM  [facility] [address] [place] [address] [place], [place], [place] Phone: [phone]   Fax:  [phone]  Name: Allen Henry. MRN: Loretha Brasil Date of Birth: 1956-06-16

## 2021-09-07 ENCOUNTER — Encounter: Payer: BC Managed Care – PPO | Admitting: Physical Therapy

## 2021-09-11 ENCOUNTER — Ambulatory Visit (INDEPENDENT_AMBULATORY_CARE_PROVIDER_SITE_OTHER): Payer: BC Managed Care – PPO | Admitting: Specialist

## 2021-09-11 ENCOUNTER — Encounter: Payer: Self-pay | Admitting: Specialist

## 2021-09-11 ENCOUNTER — Other Ambulatory Visit: Payer: Self-pay

## 2021-09-11 VITALS — BP 148/88 | HR 86 | Ht 70.0 in | Wt 299.0 lb

## 2021-09-11 DIAGNOSIS — M48061 Spinal stenosis, lumbar region without neurogenic claudication: Secondary | ICD-10-CM

## 2021-09-11 DIAGNOSIS — M160 Bilateral primary osteoarthritis of hip: Secondary | ICD-10-CM

## 2021-09-11 DIAGNOSIS — M4316 Spondylolisthesis, lumbar region: Secondary | ICD-10-CM

## 2021-09-11 DIAGNOSIS — M47816 Spondylosis without myelopathy or radiculopathy, lumbar region: Secondary | ICD-10-CM

## 2021-09-11 DIAGNOSIS — M5136 Other intervertebral disc degeneration, lumbar region: Secondary | ICD-10-CM

## 2021-09-11 MED ORDER — METHOCARBAMOL 500 MG PO TABS: ORAL_TABLET | ORAL | 0 refills | Status: DC

## 2021-09-11 NOTE — Progress Notes (Signed)
Office Visit Note   Patient: Allen Henry.           Date of Birth: 08-23-1956           MRN: [id number] Visit Date: 09/11/2021              Requested by: Marvis Repress, MD No address on file PCP: Marvis Repress, MD   Assessment & Plan: Visit Diagnoses:  1. Spondylolisthesis, lumbar region   2. Spinal stenosis of lumbar region without neurogenic claudication   3. Degenerative disc disease, lumbar   4. Spondylosis without myelopathy or radiculopathy, lumbar region   5. Spondylolisthesis of lumbar region   6. Bilateral primary osteoarthritis of hip     Plan: Avoid frequent bending and stooping  No lifting greater than 10 lbs. May use ice or moist heat for pain. Weight loss is of benefit. Best medication for lumbar disc disease is arthritis medications like motrin, celebrex and naprosyn. Exercise is important to improve your indurance and does allow people to function better inspite of back pain.  Therapy and if it is not successful then fusion of the segments that are painful would be a consideration.  Follow-Up Instructions: No follow-ups on file.   Orders:  No orders of the defined types were placed in this encounter.  No orders of the defined types were placed in this encounter.     Procedures: No procedures performed   Clinical Data: Findings:  Narrative & Impression CLINICAL DATA:  Spondylolisthesis. Low back pain, greater than 6 weeks. Prior surgery. Worsening low back pain and lower extremity radicular pain. Pain radiates to the right buttock into both legs.   EXAM: MRI LUMBAR SPINE WITHOUT AND WITH CONTRAST   TECHNIQUE: Multiplanar and multiecho pulse sequences of the lumbar spine were obtained without and with intravenous contrast.   CONTRAST:  10mL MULTIHANCE GADOBENATE DIMEGLUMINE 529 MG/ML IV SOLN   COMPARISON:  Radiography 09/08/2020   FINDINGS: Segmentation:  5 lumbar type vertebral bodies.   Alignment:  Normal except  for 1 mm of anterolisthesis at L4-5.   Vertebrae:  No focal bone lesion.   Conus medullaris and cauda equina: Conus extends to the L1 level. Conus and cauda equina appear normal.   Paraspinal and other soft tissues: Negative   Disc levels:   T11-12 and T12-L1: Minimal noncompressive disc bulges.   L1-2: Normal interspace.   L2-3: No disc abnormality.  Minimal facet hypertrophy.  No stenosis.   L3-4: Previous right hemilaminectomy. No disc bulge or herniation. Mild bony foraminal narrowing on the right.   L4-5: Bilateral facet arthropathy worse on the right. 1 mm of anterolisthesis. Bulging of the disc. Previous posterior decompression. No canal stenosis. Bilateral foraminal stenosis, right worse than left.   L5-S1: Previous posterior decompression. Wide patency of the canal and foramina.   IMPRESSION: L3-4: Previous right hemilaminectomy. Mild right foraminal narrowing due to encroachment by osteophytes but without definite compression of the exiting L3 nerve.   L4-5: Previous posterior decompression. Bilateral facet arthropathy with 1 mm of anterolisthesis. Bulging of the disc. Bilateral foraminal narrowing right more than left potential to affect the exiting L4 nerves.   L5-S1: Previous posterior decompression. Sufficient patency of the canal and foramina.     Electronically Signed   By: Paulina Fusi M.D.   On: 08/06/2021 18:28      Subjective: No chief complaint on file.   65 year old male with history of left knee old injury and was in PT  for his spine. He reports having to stop PT due to left knee swelling and pain. The pain is improving after stopping PT. He is still having back pain and pain into the buttocks and thighs.    Review of Systems  Constitutional: Negative.  Negative for activity change, appetite change, chills, diaphoresis, fatigue, fever and unexpected weight change.  HENT: Negative.    Eyes: Negative.   Respiratory: Negative.     Cardiovascular: Negative.   Gastrointestinal: Negative.   Endocrine: Negative.   Genitourinary: Negative.   Musculoskeletal: Negative.   Skin: Negative.   Allergic/Immunologic: Negative.   Neurological: Negative.   Hematological: Negative.   Psychiatric/Behavioral: Negative.      Objective: Vital Signs: BP (!) 148/88   Pulse 86   Ht 5\' 10"  (1.778 m)   Wt 299 lb (135.6 kg)   BMI 42.90 kg/m   Physical Exam Constitutional:      Appearance: He is well-developed.  HENT:     Head: Normocephalic and atraumatic.  Eyes:     Pupils: Pupils are equal, round, and reactive to light.  Pulmonary:     Effort: Pulmonary effort is normal.     Breath sounds: Normal breath sounds.  Abdominal:     General: Bowel sounds are normal.     Palpations: Abdomen is soft.  Musculoskeletal:        General: Normal range of motion.     Cervical back: Normal range of motion and neck supple.  Skin:    General: Skin is warm and dry.  Neurological:     Mental Status: He is alert and oriented to person, place, and time.  Psychiatric:        Behavior: Behavior normal.        Thought Content: Thought content normal.        Judgment: Judgment normal.    Left Knee Exam   Tenderness  The patient is experiencing tenderness in the lateral joint line, lateral retinaculum and LCL.  Range of Motion  Extension:  normal  Flexion:  normal   Tests   Valgus: positive Lachman:      Left knee posterior Lachman test: posterior draw and posterolateral compartment. Patellar apprehension: 2+  Other  Erythema: absent Scars: present Sensation: normal Pulse: present Swelling: mild     Specialty Comments:  No specialty comments available.  Imaging: No results found.   PMFS History: Patient Active Problem List   Diagnosis Date Noted   Spinal stenosis of lumbar region 06/19/2017    Priority: High    Class: Chronic   Status post lumbar laminectomy 06/19/2017   Past Medical History:  Diagnosis  Date   Arthritis    Headache    Pneumonia    Vertigo     Family History  Problem Relation Age of Onset   Heart attack Father    Cancer Father    Cancer Mother     Past Surgical History:  Procedure Laterality Date   COLONOSCOPY     LUMBAR LAMINECTOMY N/A 06/19/2017   Procedure: RIGHT LUMBAR THREE-FOUE AND LUMBAR FIVE-SACRAL ONE  PARTIAL HEMILAMINECTOMY, BILATERAL PARTIAL HEMILAMINECTOMY LUMBAR FOUR-FIVE;  Surgeon: 08/19/2017, MD;  Location: [facility] OR;  Service: Orthopedics;  Laterality: N/A;  RIGHT Lumbar three-four AND Lumbar five-Sacrum one  PARTIAL HEMILAMINECTOMY, BILATERAL PARTIAL HEMILAMINECTOMY Lumbar four-five    TONSILLECTOMY     Social History   Occupational History   Not on file  Tobacco Use   Smoking status: Former    Types: Cigarettes  Quit date: 06/12/2002    Years since quitting: 19.2   Smokeless tobacco: Never  Vaping Use   Vaping Use: Never used  Substance and Sexual Activity   Alcohol use: No   Drug use: No    Types: PCP   Sexual activity: Not on file

## 2021-09-11 NOTE — Patient Instructions (Signed)
Avoid frequent bending and stooping  No lifting greater than 10 lbs. May use ice or moist heat for pain. Weight loss is of benefit. Best medication for lumbar disc disease is arthritis medications like motrin, celebrex and naprosyn. Exercise is important to improve your indurance and does allow people to function better inspite of back pain.  Therapy and if it is not successful then fusion of the segments that are painful would be a consideration.

## 2021-09-12 ENCOUNTER — Encounter: Payer: Self-pay | Admitting: Physical Therapy

## 2021-09-12 ENCOUNTER — Ambulatory Visit (INDEPENDENT_AMBULATORY_CARE_PROVIDER_SITE_OTHER): Payer: BC Managed Care – PPO | Admitting: Physical Therapy

## 2021-09-12 DIAGNOSIS — R293 Abnormal posture: Secondary | ICD-10-CM | POA: Diagnosis not present

## 2021-09-12 DIAGNOSIS — M5441 Lumbago with sciatica, right side: Secondary | ICD-10-CM | POA: Diagnosis not present

## 2021-09-12 DIAGNOSIS — R262 Difficulty in walking, not elsewhere classified: Secondary | ICD-10-CM | POA: Diagnosis not present

## 2021-09-12 DIAGNOSIS — M6281 Muscle weakness (generalized): Secondary | ICD-10-CM | POA: Diagnosis not present

## 2021-09-12 DIAGNOSIS — G8929 Other chronic pain: Secondary | ICD-10-CM

## 2021-09-12 NOTE — Therapy (Signed)
[facility] [place] [address] [place], [place], [address] Phone: [phone]   Fax:  [phone]  Physical Therapy Treatment  Patient Details  Name: Allen Henry. MRN: [id number] Date of Birth: [birth year] Referring Provider (PT): Kerrin Champagne, MD   Encounter Date: 09/12/2021   PT End of Session - 09/12/21 1016     Visit Number 3    Number of Visits 12    Date for PT Re-Evaluation 10/09/21    Authorization Type BCBS    PT Start Time 1017    PT Stop Time 1100    PT Time Calculation (min) 43 min    Activity Tolerance Patient tolerated treatment well    Behavior During Therapy WFL for tasks assessed/performed             Past Medical History:  Diagnosis Date   Arthritis    Headache    Pneumonia    Vertigo     Past Surgical History:  Procedure Laterality Date   COLONOSCOPY     LUMBAR LAMINECTOMY N/A 06/19/2017   Procedure: RIGHT LUMBAR THREE-FOUE AND LUMBAR FIVE-SACRAL ONE  PARTIAL HEMILAMINECTOMY, BILATERAL PARTIAL HEMILAMINECTOMY LUMBAR FOUR-FIVE;  Surgeon: Kerrin Champagne, MD;  Location: [facility] OR;  Service: Orthopedics;  Laterality: N/A;  RIGHT Lumbar three-four AND Lumbar five-Sacrum one  PARTIAL HEMILAMINECTOMY, BILATERAL PARTIAL HEMILAMINECTOMY Lumbar four-five    TONSILLECTOMY      There were no vitals filed for this visit.   Subjective Assessment - 09/12/21 1023     Subjective Pt reports that his Lt knee has been irritated for over a week now.  He states he is doing a couple of the HEP exercises every other day.    Diagnostic tests --    Patient Stated Goals Decrease pain and numbness    Currently in Pain? Yes    Pain Score 4     Pain Location Knee    Pain Descriptors / Indicators Tightness    Pain Score 5    Pain Location Back    Pain Orientation Lower    Pain Descriptors / Indicators Sore    Aggravating Factors  frequent bending    Pain Relieving Factors ?                [facility]  PT Assessment - 09/12/21 0001       Assessment   Medical Diagnosis M51.36 (ICD-10-CM) - Degenerative disc disease, lumbar  M43.16 (ICD-10-CM) - Spondylolisthesis, lumbar region  M48.061 (ICD-10-CM) - Spinal stenosis of lumbar region without neurogenic claudication  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, lumbar region    Referring Provider (PT) Kerrin Champagne, MD      Precautions   Precaution Comments no lifting more than 10# per MD note on 09/11/21               [facility] Adult PT Treatment/Exercise - 09/12/21 0001       Lumbar Exercises: Stretches   Passive Hamstring Stretch Left;Right;1 rep;20 seconds   seated, straight back, leaning forward.   Lower Trunk Rotation 60 seconds   rocking knees side to side, range to tolerance     Lumbar Exercises: Aerobic   Nustep L4: legs only x 6 min      Lumbar Exercises: Standing   Row Strengthening;Theraband;15 reps   cues for form. 3 sec hold in retraction   Theraband Level (Row) Level 3 (Green)    Shoulder Extension Strengthening;Theraband;Both;15 reps   3 sec in  retraction   Theraband Level (Shoulder Extension) Level 3 (Green)      Lumbar Exercises: Supine   Ab Set 10 reps;5 seconds   with lap press. counting out loud to prevent from holding breath   Bridge 10 reps    Bridge Limitations pain with first 2 reps      Lumbar Exercises: Sidelying   Clam Right;Left;15 reps    Hip Abduction Right;Left;10 reps    Hip Abduction Limitations tactile cues to keep pt on his side (from rolling backwards)               PT Long Term Goals - 09/12/21 1040       PT LONG TERM GOAL #1   Title Pt will be independent with his HEP    Time 6    Period Weeks    Status On-going    Target Date 10/09/21      PT LONG TERM GOAL #2   Title Pt will report at least 50% reduction in his LBP    Time 6    Period Weeks    Status On-going    Target Date 10/09/21      PT LONG TERM GOAL #3   Title Pt will be able to lift  at least 25 lbs for home tasks (i.e. laundry) utilizing proper form to protect his back    Time 6    Period Weeks    Status On-going    Target Date 10/09/21      PT LONG TERM GOAL #4   Title Pt will demo improved core strength by maintaining posterior pelvic tilt during Bilateral Bent Leg Lift    Baseline Unable to perform this session    Time 6    Period Weeks    Status On-going    Target Date 10/09/21      PT LONG TERM GOAL #5   Title Pt will have improved FOTO score to at least 60    Baseline 57    Time 6    Period Weeks    Status On-going    Target Date 10/09/21                   Plan - 09/12/21 1041     Clinical Impression Statement Pt reported some increase in LBP with initial bridges, but pain eased up.  He tolerated all other exercises well.  He needs occasional cues for form and keeping track of reps.  Goals are ongoing.    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;Bend    [employer];Occupation;Shop;Yard Work;Laundry;Community Activity;Cleaning;Meal Prep    Stability/Clinical Decision Making Evolving/Moderate complexity    Rehab Potential Good    PT Frequency 2x / week    PT Duration 6 weeks    PT Treatment/Interventions ADLs/Self Care Home Management;Aquatic Therapy;Cryotherapy;Electrical Stimulation;Iontophoresis 4mg /ml Dexamethasone;Moist Heat;Traction;DME Instruction;Gait training;Stair training;Functional mobility training;Therapeutic activities;Therapeutic exercise;Balance training;Neuromuscular re-education;Patient/family education;Manual techniques;Passive range of motion;Dry needling;Taping    PT Next Visit Plan Manual therapy and stretch psoas/hip flexors, QL, and lumbar paraspinals. Check pelvic alignment and address as indicated. Continue hip and core  strengthening.    PT Home Exercise Plan Access Code [email or web address]    Consulted and Agree with Plan of Care Patient             Patient will benefit from skilled therapeutic intervention in order to improve the following deficits and impairments:  Abnormal gait, Decreased range of  motion, Difficulty walking, Increased fascial restricitons, Decreased endurance, Obesity, Pain, Decreased activity tolerance, Hypomobility, Improper body mechanics, Decreased mobility, Decreased strength, Impaired sensation, Postural dysfunction  Visit Diagnosis: Muscle weakness (generalized)  Chronic bilateral low back pain with right-sided sciatica  Abnormal posture  Difficulty in walking, not elsewhere classified     Problem List Patient Active Problem List   Diagnosis Date Noted   Spinal stenosis of lumbar region 06/19/2017    Class: Chronic   Status post lumbar laminectomy 06/19/2017   Mayer Camel, PTA 09/12/21 11:02 AM  [facility] [place] [address] [place], [place], [address] Phone: [phone]   Fax:  [phone]  Name: Allen Henry. MRN: [id number] Date of Birth: 07/03/1956

## 2021-09-14 ENCOUNTER — Ambulatory Visit (INDEPENDENT_AMBULATORY_CARE_PROVIDER_SITE_OTHER): Payer: BC Managed Care – PPO | Admitting: Physical Therapy

## 2021-09-14 ENCOUNTER — Other Ambulatory Visit: Payer: Self-pay

## 2021-09-14 DIAGNOSIS — R293 Abnormal posture: Secondary | ICD-10-CM | POA: Diagnosis not present

## 2021-09-14 DIAGNOSIS — R2689 Other abnormalities of gait and mobility: Secondary | ICD-10-CM

## 2021-09-14 DIAGNOSIS — G8929 Other chronic pain: Secondary | ICD-10-CM

## 2021-09-14 DIAGNOSIS — M5441 Lumbago with sciatica, right side: Secondary | ICD-10-CM | POA: Diagnosis not present

## 2021-09-14 DIAGNOSIS — M6281 Muscle weakness (generalized): Secondary | ICD-10-CM

## 2021-09-14 DIAGNOSIS — R262 Difficulty in walking, not elsewhere classified: Secondary | ICD-10-CM | POA: Diagnosis not present

## 2021-09-14 NOTE — Therapy (Signed)
[facility] [place] [address] [place], [place], [address] Phone: [phone]   Fax:  [phone]  Physical Therapy Treatment  Patient Details  Name: Allen Henry. MRN: [id number] Date of Birth: 26-Jun-1956 Referring Provider (PT): Kerrin Champagne, MD   Encounter Date: 09/14/2021   PT End of Session - 09/14/21 1359     Visit Number 4    Number of Visits 12    Date for PT Re-Evaluation 10/09/21    Authorization Type BCBS    PT Start Time 1359    PT Stop Time 1445    PT Time Calculation (min) 46 min    Activity Tolerance Patient tolerated treatment well    Behavior During Therapy WFL for tasks assessed/performed             Past Medical History:  Diagnosis Date   Arthritis    Headache    Pneumonia    Vertigo     Past Surgical History:  Procedure Laterality Date   COLONOSCOPY     LUMBAR LAMINECTOMY N/A 06/19/2017   Procedure: RIGHT LUMBAR THREE-FOUE AND LUMBAR FIVE-SACRAL ONE  PARTIAL HEMILAMINECTOMY, BILATERAL PARTIAL HEMILAMINECTOMY LUMBAR FOUR-FIVE;  Surgeon: Kerrin Champagne, MD;  Location: [facility] OR;  Service: Orthopedics;  Laterality: N/A;  RIGHT Lumbar three-four AND Lumbar five-Sacrum one  PARTIAL HEMILAMINECTOMY, BILATERAL PARTIAL HEMILAMINECTOMY Lumbar four-five    TONSILLECTOMY      There were no vitals filed for this visit.   Subjective Assessment - 09/14/21 1401     Subjective Pt states that L knee is still a little sore but it is getting better. Pt states he saw Dr. Otelia Sergeant -- thinking about fusion. Exercises have not been too consistent "they have been going fair."    Pertinent History Prior back surgery in 2018    How long can you sit comfortably? Sometimes 5-10 min; sometimes a couple of hours    How long can you stand comfortably? minutes or hours    How long can you walk comfortably? can get around grocery store bending over the cart despite foot numbness    Diagnostic tests MRI 10/28: L3-4:  Previous right hemilaminectomy. Mild right foraminal narrowing due to encroachment by osteophytes but without definite compression of the exiting L3 nerve.  L4-5: Previous posterior decompression. Bilateral facet arthropathy  with 1 mm of anterolisthesis. Bulging of the disc. Bilateral  foraminal narrowing right more than left potential to affect the  exiting L4 nerves.  L5-S1: Previous posterior decompression. Sufficient patency of the  canal and foramina.    Patient Stated Goals Decrease pain and numbness    Currently in Pain? Yes    Pain Score 2     Pain Location Knee    Pain Orientation Left    Pain Score 3    Pain Location Back    Pain Orientation Lower    Pain Descriptors / Indicators Sore    Pain Type Chronic pain                               [facility] Adult PT Treatment/Exercise - 09/14/21 0001       Lumbar Exercises: Stretches   Lower Trunk Rotation 20 seconds;5 reps    Hip Flexor Stretch Right;30 seconds    Hip Flexor Stretch Limitations modified thomas stretch    Other Lumbar Stretch Exercise Doorway pec stretch 60 deg x30 sec      Lumbar Exercises:  Aerobic   Nustep L5 LEs only x 5 min      Lumbar Exercises: Standing   Row Strengthening;Theraband;15 reps    Theraband Level (Row) Level 4 (Blue)    Shoulder Extension Strengthening;Theraband;Both;15 reps    Theraband Level (Shoulder Extension) Level 4 (Blue)    Other Standing Lumbar Exercises Palloff press green tband 2x10      Lumbar Exercises: Seated   Sit to Stand 10 reps    Sit to Stand Limitations with hip hinge      Lumbar Exercises: Supine   Pelvic Tilt 10 reps   with marching   Bridge 10 reps;3 seconds      Lumbar Exercises: Sidelying   Hip Abduction Right;Left;20 reps    Hip Abduction Limitations tactile cues to keep hips forward      Manual Therapy   Manual therapy comments STM & TPR R QL, thoracic & lumbar paraspinals    Joint Mobilization grade II and III lumbar gapping in sidelying  and slight rotation                          PT Long Term Goals - 09/12/21 1040       PT LONG TERM GOAL #1   Title Pt will be independent with his HEP    Time 6    Period Weeks    Status On-going    Target Date 10/09/21      PT LONG TERM GOAL #2   Title Pt will report at least 50% reduction in his LBP    Time 6    Period Weeks    Status On-going    Target Date 10/09/21      PT LONG TERM GOAL #3   Title Pt will be able to lift at least 25 lbs for home tasks (i.e. laundry) utilizing proper form to protect his back    Time 6    Period Weeks    Status On-going    Target Date 10/09/21      PT LONG TERM GOAL #4   Title Pt will demo improved core strength by maintaining posterior pelvic tilt during Bilateral Bent Leg Lift    Baseline Unable to perform this session    Time 6    Period Weeks    Status On-going    Target Date 10/09/21      PT LONG TERM GOAL #5   Title Pt will have improved FOTO score to at least 60    Baseline 57    Time 6    Period Weeks    Status On-going    Target Date 10/09/21                   Plan - 09/14/21 1438     Clinical Impression Statement Treatment focused on progressing pt's strengthening. Discussed using hip hinging to reduce lumbar strain with sit<>stand and to increase use of hips. Requires cues to keep posture.    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;Bend    [employer];Occupation;Shop;Yard Work;Laundry;Community Activity;Cleaning;Meal Prep    Stability/Clinical Decision Making Evolving/Moderate complexity    Rehab Potential Good    PT Frequency 2x / week    PT Duration 6 weeks    PT Treatment/Interventions ADLs/Self Care Home Management;Aquatic Therapy;Cryotherapy;Electrical Stimulation;Iontophoresis 4mg /ml  Dexamethasone;Moist Heat;Traction;DME Instruction;Gait training;Stair training;Functional mobility training;Therapeutic activities;Therapeutic exercise;Balance training;Neuromuscular re-education;Patient/family education;Manual techniques;Passive range of motion;Dry  needling;Taping    PT Next Visit Plan Manual therapy and stretch psoas/hip flexors, QL, and lumbar paraspinals. Check pelvic alignment and address as indicated. Continue hip and core strengthening.    PT Home Exercise Plan Access Code [email or web address]    Consulted and Agree with Plan of Care Patient             Patient will benefit from skilled therapeutic intervention in order to improve the following deficits and impairments:  Abnormal gait, Decreased range of motion, Difficulty walking, Increased fascial restricitons, Decreased endurance, Obesity, Pain, Decreased activity tolerance, Hypomobility, Improper body mechanics, Decreased mobility, Decreased strength, Impaired sensation, Postural dysfunction  Visit Diagnosis: Muscle weakness (generalized)  Chronic bilateral low back pain with right-sided sciatica  Abnormal posture  Difficulty in walking, not elsewhere classified  Other abnormalities of gait and mobility     Problem List Patient Active Problem List   Diagnosis Date Noted   Spinal stenosis of lumbar region 06/19/2017    Class: Chronic   Status post lumbar laminectomy 06/19/2017    [facility] April Dell Ponto, PT, DPT 09/14/2021, 2:42 PM  [facility] [address] [place], [place], [address] Phone: [phone]   Fax:  [phone]  Name: Allen Henry. MRN: [id number] Date of Birth: Apr 29, 1956

## 2021-09-18 ENCOUNTER — Ambulatory Visit (INDEPENDENT_AMBULATORY_CARE_PROVIDER_SITE_OTHER): Payer: BC Managed Care – PPO | Admitting: Physical Therapy

## 2021-09-18 ENCOUNTER — Other Ambulatory Visit: Payer: Self-pay

## 2021-09-18 DIAGNOSIS — M6281 Muscle weakness (generalized): Secondary | ICD-10-CM | POA: Diagnosis not present

## 2021-09-18 DIAGNOSIS — G8929 Other chronic pain: Secondary | ICD-10-CM

## 2021-09-18 DIAGNOSIS — R2689 Other abnormalities of gait and mobility: Secondary | ICD-10-CM

## 2021-09-18 DIAGNOSIS — R262 Difficulty in walking, not elsewhere classified: Secondary | ICD-10-CM

## 2021-09-18 DIAGNOSIS — M5441 Lumbago with sciatica, right side: Secondary | ICD-10-CM

## 2021-09-18 DIAGNOSIS — R293 Abnormal posture: Secondary | ICD-10-CM

## 2021-09-18 NOTE — Therapy (Signed)
[facility] [place] [address] [place], [place], [address] Phone: [phone]   Fax:  [phone]  Physical Therapy Treatment  Patient Details  Name: Rik Wadel. MRN: [id number] Date of Birth: 12-Mar-1956 Referring Provider (PT): Kerrin Champagne, MD   Encounter Date: 09/18/2021   PT End of Session - 09/18/21 1352     Visit Number 5    Number of Visits 12    Date for PT Re-Evaluation 10/09/21    Authorization Type BCBS    PT Start Time 1350    PT Stop Time 1430    PT Time Calculation (min) 40 min    Activity Tolerance Patient tolerated treatment well    Behavior During Therapy WFL for tasks assessed/performed             Past Medical History:  Diagnosis Date   Arthritis    Headache    Pneumonia    Vertigo     Past Surgical History:  Procedure Laterality Date   COLONOSCOPY     LUMBAR LAMINECTOMY N/A 06/19/2017   Procedure: RIGHT LUMBAR THREE-FOUE AND LUMBAR FIVE-SACRAL ONE  PARTIAL HEMILAMINECTOMY, BILATERAL PARTIAL HEMILAMINECTOMY LUMBAR FOUR-FIVE;  Surgeon: Kerrin Champagne, MD;  Location: [facility] OR;  Service: Orthopedics;  Laterality: N/A;  RIGHT Lumbar three-four AND Lumbar five-Sacrum one  PARTIAL HEMILAMINECTOMY, BILATERAL PARTIAL HEMILAMINECTOMY Lumbar four-five    TONSILLECTOMY      There were no vitals filed for this visit.   Subjective Assessment - 09/18/21 1353     Subjective Pt states he was coughing during the weekend. Pt states his knee is fair.    Pertinent History Prior back surgery in 2018    How long can you sit comfortably? Sometimes 5-10 min; sometimes a couple of hours    How long can you stand comfortably? minutes or hours    How long can you walk comfortably? can get around grocery store bending over the cart despite foot numbness    Diagnostic tests MRI 10/28: L3-4: Previous right hemilaminectomy. Mild right foraminal narrowing due to encroachment by osteophytes but without  definite compression of the exiting L3 nerve.  L4-5: Previous posterior decompression. Bilateral facet arthropathy  with 1 mm of anterolisthesis. Bulging of the disc. Bilateral  foraminal narrowing right more than left potential to affect the  exiting L4 nerves.  L5-S1: Previous posterior decompression. Sufficient patency of the  canal and foramina.    Patient Stated Goals Decrease pain and numbness    Currently in Pain? 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, lumbar region    Referring Provider (PT) Kerrin Champagne, MD                           [facility] Adult PT  Treatment/Exercise - 09/18/21 0001       Lumbar Exercises: Stretches   Passive Hamstring Stretch Left;Right;1 rep;20 seconds    Single Knee to Chest Stretch Right;Left;30 seconds    Lower Trunk Rotation 1 rep;30 seconds    Hip Flexor Stretch Right;30 seconds    Hip Flexor Stretch Limitations modified thomas stretch      Lumbar Exercises: Aerobic   Nustep L5 UEs/ LEs x 5 min      Lumbar Exercises: Standing   Row Strengthening;Theraband;10 reps    Theraband Level (Row) Level 4 (Blue)    Row Limitations iso holds with step back    Shoulder Extension Strengthening;Theraband;Both;15 reps    Theraband Level (Shoulder Extension) Level 4 (Blue)    Other Standing Lumbar Exercises Palloff press blue tband 2x10    Other Standing Lumbar Exercises Lateral band walk 2x10 red  tband; hip extension green tband x10      Lumbar Exercises: Supine   Ab Set 10 reps;5 seconds    AB Set Limitations with pball press down    Pelvic Tilt 10 reps    Bent Knee Raise 10 reps    Bridge 10 reps;Non-compliant;3 seconds      Lumbar Exercises: Sidelying   Hip Abduction Right;Left;20 reps      Manual Therapy   Manual therapy comments STM & TPR R QL, thoracic & lumbar paraspinals                          PT Long Term Goals - 09/12/21 1040       PT LONG TERM GOAL #1   Title Pt will be independent with his HEP    Time 6    Period Weeks    Status On-going    Target Date 10/09/21      PT LONG TERM GOAL #2   Title Pt will report at least 50% reduction in his LBP    Time 6    Period Weeks    Status On-going    Target Date 10/09/21      PT LONG TERM GOAL #3   Title Pt will be able to lift at least 25 lbs for home tasks (i.e. laundry) utilizing proper form to protect his back    Time 6    Period Weeks    Status On-going    Target Date 10/09/21      PT LONG TERM GOAL #4   Title Pt will demo improved core strength by maintaining posterior pelvic tilt during Bilateral Bent Leg Lift    Baseline Unable to perform this session    Time 6    Period Weeks    Status On-going    Target Date 10/09/21      PT LONG TERM GOAL #5   Title Pt will have improved FOTO score to at least 60    Baseline 57    Time 6    Period Weeks    Status On-going    Target Date 10/09/21                   Plan - 09/18/21 1401     Clinical Impression Statement Treatment session focused primarily on core strengthening. Continued to work on progressing hip strengthening in standing and standing posture. Continued to encourage pt to work on his HEP at home to get more improvement.    Personal Factors and Comorbidities Age;Fitness;Time since onset of injury/illness/exacerbation    Examination-Activity Limitations Locomotion  Level;Transfers;Squat;Stairs;Stand;Lift;Carry;Caring for Others;Bend    [employer];Occupation;Shop;Yard Work;Laundry;Community Activity;Cleaning;Meal Prep    Stability/Clinical Decision Making Evolving/Moderate complexity    Rehab Potential Good    PT Frequency 2x / week    PT Duration 6 weeks    PT Treatment/Interventions ADLs/Self Care Home Management;Aquatic Therapy;Cryotherapy;Electrical Stimulation;Iontophoresis 4mg /ml Dexamethasone;Moist Heat;Traction;DME Instruction;Gait training;Stair training;Functional mobility training;Therapeutic activities;Therapeutic exercise;Balance training;Neuromuscular re-education;Patient/family education;Manual techniques;Passive range of motion;Dry needling;Taping    PT Next Visit Plan Manual therapy and stretch psoas/hip flexors, QL, and lumbar paraspinals. Check pelvic alignment and address as indicated. Continue hip and core strengthening.    PT Home Exercise Plan Access Code [email or web address]    Consulted and Agree with Plan of Care Patient             Patient will benefit from skilled therapeutic intervention in order to improve the following deficits and impairments:  Abnormal gait, Decreased range of motion, Difficulty walking, Increased fascial restricitons, Decreased endurance, Obesity, Pain, Decreased activity tolerance, Hypomobility, Improper body mechanics, Decreased mobility, Decreased strength, Impaired sensation, Postural dysfunction  Visit Diagnosis: Muscle weakness (generalized)  Chronic bilateral low back pain with right-sided sciatica  Abnormal posture  Difficulty in walking, not elsewhere classified  Other abnormalities of gait and mobility     Problem List Patient Active Problem List   Diagnosis Date Noted   Spinal stenosis of lumbar region 06/19/2017    Class: Chronic   Status post lumbar laminectomy 06/19/2017    [facility] April May, PT, DPT 09/18/2021, 2:32 PM  [facility] [address] [place], [place], [place] Phone: [phone]   Fax:  [phone]  Name: Daymen Hassebrock. MRN: Loretha Brasil Date of Birth: 1956/04/17

## 2021-09-21 ENCOUNTER — Other Ambulatory Visit: Payer: Self-pay

## 2021-09-21 ENCOUNTER — Ambulatory Visit (INDEPENDENT_AMBULATORY_CARE_PROVIDER_SITE_OTHER): Payer: BC Managed Care – PPO | Admitting: Physical Therapy

## 2021-09-21 DIAGNOSIS — R293 Abnormal posture: Secondary | ICD-10-CM | POA: Diagnosis not present

## 2021-09-21 DIAGNOSIS — R262 Difficulty in walking, not elsewhere classified: Secondary | ICD-10-CM

## 2021-09-21 DIAGNOSIS — G8929 Other chronic pain: Secondary | ICD-10-CM

## 2021-09-21 DIAGNOSIS — R2689 Other abnormalities of gait and mobility: Secondary | ICD-10-CM

## 2021-09-21 DIAGNOSIS — M5441 Lumbago with sciatica, right side: Secondary | ICD-10-CM | POA: Diagnosis not present

## 2021-09-21 DIAGNOSIS — M6281 Muscle weakness (generalized): Secondary | ICD-10-CM

## 2021-09-21 NOTE — Therapy (Signed)
[facility] [place] [address] [place], [place], [address] Phone: [phone]   Fax:  [phone]  Physical Therapy Treatment  Patient Details  Name: Allen Henry. MRN: [id number] Date of Birth: [birth year] Referring Provider (PT): Kerrin Champagne, MD   Encounter Date: 09/21/2021   PT End of Session - 09/21/21 1400     Visit Number 6    Number of Visits 12    Date for PT Re-Evaluation 10/09/21    Authorization Type BCBS    PT Start Time 1401    PT Stop Time 1445    PT Time Calculation (min) 44 min    Activity Tolerance Patient tolerated treatment well    Behavior During Therapy WFL for tasks assessed/performed             Past Medical History:  Diagnosis Date   Arthritis    Headache    Pneumonia    Vertigo     Past Surgical History:  Procedure Laterality Date   COLONOSCOPY     LUMBAR LAMINECTOMY N/A 06/19/2017   Procedure: RIGHT LUMBAR THREE-FOUE AND LUMBAR FIVE-SACRAL ONE  PARTIAL HEMILAMINECTOMY, BILATERAL PARTIAL HEMILAMINECTOMY LUMBAR FOUR-FIVE;  Surgeon: Kerrin Champagne, MD;  Location: [facility] OR;  Service: Orthopedics;  Laterality: N/A;  RIGHT Lumbar three-four AND Lumbar five-Sacrum one  PARTIAL HEMILAMINECTOMY, BILATERAL PARTIAL HEMILAMINECTOMY Lumbar four-five    TONSILLECTOMY      There were no vitals filed for this visit.   Subjective Assessment - 09/21/21 1404     Subjective Pt reports some increased soreness on Tuesday.    Pertinent History Prior back surgery in 2018    How long can you sit comfortably? Sometimes 5-10 min; sometimes a couple of hours    How long can you stand comfortably? minutes or hours    How long can you walk comfortably? can get around grocery store bending over the cart despite foot numbness    Diagnostic tests MRI 10/28: L3-4: Previous right hemilaminectomy. Mild right foraminal narrowing due to encroachment by osteophytes but without definite compression of the  exiting L3 nerve.  L4-5: Previous posterior decompression. Bilateral facet arthropathy  with 1 mm of anterolisthesis. Bulging of the disc. Bilateral  foraminal narrowing right more than left potential to affect the  exiting L4 nerves.  L5-S1: Previous posterior decompression. Sufficient patency of the  canal and foramina.    Patient Stated Goals Decrease pain and numbness    Currently in Pain? Yes    Pain Score 2     Pain Location Knee    Pain Orientation Left    Pain Score 6    Pain Location Back    Pain Orientation Lower    Pain Descriptors / Indicators Sore                               [facility] Adult PT Treatment/Exercise - 09/21/21 0001       Lumbar Exercises: Stretches   Prone on Elbows Stretch 60 seconds    Press Ups 10 reps    Quad Stretch 30 seconds;Right;Left;2 reps    Other Lumbar Stretch Exercise Sidelying open/close book x10 each side      Lumbar Exercises: Physiological scientist;Theraband;20 reps    Theraband Level (Row) Level 4 (Blue)    Shoulder Extension Strengthening;Theraband;Both;20 reps    Theraband Level (Shoulder Extension) Level 4 (Blue)    Other Standing Lumbar Exercises antirotation +  side stepping green tband x10      Lumbar Exercises: Seated   Other Seated Lumbar Exercises Forward flexion to toes x10; flexion with side flexion x10 each side    Other Seated Lumbar Exercises thoracic extension x10      Lumbar Exercises: Supine   Bent Knee Raise 10 reps      Lumbar Exercises: Prone   Straight Leg Raise 10 reps    Other Prone Lumbar Exercises Hamstring curl 2x10 green tband      Manual Therapy   Joint Mobilization grade II and III hip mobilization for improved extension                          PT Long Term Goals - 09/12/21 1040       PT LONG TERM GOAL #1   Title Pt will be independent with his HEP    Time 6    Period Weeks    Status On-going    Target Date 10/09/21      PT LONG TERM GOAL #2   Title  Pt will report at least 50% reduction in his LBP    Time 6    Period Weeks    Status On-going    Target Date 10/09/21      PT LONG TERM GOAL #3   Title Pt will be able to lift at least 25 lbs for home tasks (i.e. laundry) utilizing proper form to protect his back    Time 6    Period Weeks    Status On-going    Target Date 10/09/21      PT LONG TERM GOAL #4   Title Pt will demo improved core strength by maintaining posterior pelvic tilt during Bilateral Bent Leg Lift    Baseline Unable to perform this session    Time 6    Period Weeks    Status On-going    Target Date 10/09/21      PT LONG TERM GOAL #5   Title Pt will have improved FOTO score to at least 60    Baseline 57    Time 6    Period Weeks    Status On-going    Target Date 10/09/21                   Plan - 09/21/21 1421     Clinical Impression Statement Continued to work on posterior hip/glute strengthening and improving hip extension. Manual therapy provided for upper R QL and thoracolumbar paraspinals. Progressing pt's core exercises. Pt remains non compliant to his HEP. Continued encouragement for pt to be more aware of his posture and reduce trunk flexion.    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;Bend    [employer];Occupation;Shop;Yard Work;Laundry;Community Activity;Cleaning;Meal Prep    Stability/Clinical Decision Making Evolving/Moderate complexity    Rehab Potential Good    PT Frequency 2x / week    PT Duration 6 weeks    PT Treatment/Interventions ADLs/Self Care Home Management;Aquatic Therapy;Cryotherapy;Electrical Stimulation;Iontophoresis 4mg /ml Dexamethasone;Moist Heat;Traction;DME Instruction;Gait training;Stair training;Functional mobility training;Therapeutic activities;Therapeutic  exercise;Balance training;Neuromuscular re-education;Patient/family education;Manual techniques;Passive range of motion;Dry needling;Taping    PT Next Visit Plan Manual therapy and stretch psoas/hip flexors, QL, and lumbar paraspinals. Check pelvic alignment and address as indicated. Continue hip and core strengthening.    PT Home Exercise Plan Access Code [email or web address]    Consulted and Agree with Plan of Care  Patient             Patient will benefit from skilled therapeutic intervention in order to improve the following deficits and impairments:  Abnormal gait, Decreased range of motion, Difficulty walking, Increased fascial restricitons, Decreased endurance, Obesity, Pain, Decreased activity tolerance, Hypomobility, Improper body mechanics, Decreased mobility, Decreased strength, Impaired sensation, Postural dysfunction  Visit Diagnosis: Muscle weakness (generalized)  Chronic bilateral low back pain with right-sided sciatica  Abnormal posture  Difficulty in walking, not elsewhere classified  Other abnormalities of gait and mobility     Problem List Patient Active Problem List   Diagnosis Date Noted   Spinal stenosis of lumbar region 06/19/2017    Class: Chronic   Status post lumbar laminectomy 06/19/2017    [facility] April Dell Ponto, PT, DPT 09/21/2021, 2:47 PM  [facility] [address] [place], [place], [address] Phone: [phone]   Fax:  [phone]  Name: Allen Henry. MRN: [id number] Date of Birth: 08-13-1956

## 2021-09-25 ENCOUNTER — Ambulatory Visit (INDEPENDENT_AMBULATORY_CARE_PROVIDER_SITE_OTHER): Payer: BC Managed Care – PPO | Admitting: Physical Therapy

## 2021-09-25 ENCOUNTER — Other Ambulatory Visit: Payer: Self-pay

## 2021-09-25 DIAGNOSIS — R262 Difficulty in walking, not elsewhere classified: Secondary | ICD-10-CM

## 2021-09-25 DIAGNOSIS — G8929 Other chronic pain: Secondary | ICD-10-CM

## 2021-09-25 DIAGNOSIS — M6281 Muscle weakness (generalized): Secondary | ICD-10-CM | POA: Diagnosis not present

## 2021-09-25 DIAGNOSIS — M5441 Lumbago with sciatica, right side: Secondary | ICD-10-CM | POA: Diagnosis not present

## 2021-09-25 DIAGNOSIS — R293 Abnormal posture: Secondary | ICD-10-CM

## 2021-09-25 DIAGNOSIS — R2689 Other abnormalities of gait and mobility: Secondary | ICD-10-CM

## 2021-09-25 NOTE — Therapy (Signed)
[facility] [place] [address] [place], [place], [address] Phone: [phone]   Fax:  [phone]  Physical Therapy Treatment  Patient Details  Name: Allen Henry. MRN: [id number] Date of Birth: 1956/10/02 Referring Provider (PT): Kerrin Champagne, MD   Encounter Date: 09/25/2021   PT End of Session - 09/25/21 1351     Visit Number 7    Number of Visits 12    Date for PT Re-Evaluation 10/09/21    Authorization Type BCBS    PT Start Time 1352    PT Stop Time 1430    PT Time Calculation (min) 38 min    Activity Tolerance Patient tolerated treatment well    Behavior During Therapy WFL for tasks assessed/performed             Past Medical History:  Diagnosis Date   Arthritis    Headache    Pneumonia    Vertigo     Past Surgical History:  Procedure Laterality Date   COLONOSCOPY     LUMBAR LAMINECTOMY N/A 06/19/2017   Procedure: RIGHT LUMBAR THREE-FOUE AND LUMBAR FIVE-SACRAL ONE  PARTIAL HEMILAMINECTOMY, BILATERAL PARTIAL HEMILAMINECTOMY LUMBAR FOUR-FIVE;  Surgeon: Kerrin Champagne, MD;  Location: [facility] OR;  Service: Orthopedics;  Laterality: N/A;  RIGHT Lumbar three-four AND Lumbar five-Sacrum one  PARTIAL HEMILAMINECTOMY, BILATERAL PARTIAL HEMILAMINECTOMY Lumbar four-five    TONSILLECTOMY      There were no vitals filed for this visit.   Subjective Assessment - 09/25/21 1352     Subjective Pt reports the only exercise he does was moving his knee. Pt notes less instances of his leg falling asleep this weekend.    Pertinent History Prior back surgery in 2018    How long can you sit comfortably? Sometimes 5-10 min; sometimes a couple of hours    How long can you stand comfortably? minutes or hours    How long can you walk comfortably? can get around grocery store bending over the cart despite foot numbness    Diagnostic tests MRI 10/28: L3-4: Previous right hemilaminectomy. Mild right foraminal narrowing due  to encroachment by osteophytes but without definite compression of the exiting L3 nerve.  L4-5: Previous posterior decompression. Bilateral facet arthropathy  with 1 mm of anterolisthesis. Bulging of the disc. Bilateral  foraminal narrowing right more than left potential to affect the  exiting L4 nerves.  L5-S1: Previous posterior decompression. Sufficient patency of the  canal and foramina.    Patient Stated Goals Decrease pain and numbness    Currently in Pain? Yes    Pain Score 1     Pain Score 6    Pain Location Back    Pain Orientation Lower    Pain Descriptors / Indicators Sore    Pain Type Chronic pain                               [facility] Adult PT Treatment/Exercise - 09/25/21 0001       Lumbar Exercises: Stretches   Passive Hamstring Stretch Left;Right;1 rep;30 seconds    Single Knee to Chest Stretch Right;Left;30 seconds    Hip Flexor Stretch Right;30 seconds;Left    Hip Flexor Stretch Limitations modified thomas stretch    Other Lumbar Stretch Exercise Sidelying open/close book x10 each side      Lumbar Exercises: Seated   Sit to Stand 10 reps    Sit to Stand Limitations with hip  hinge      Lumbar Exercises: Supine   Pelvic Tilt 10 reps    Bridge 10 reps;Non-compliant;3 seconds    Other Supine Lumbar Exercises Marching 2x10 with PPT    Other Supine Lumbar Exercises Feet on pball with knee flexion + PPT x5      Lumbar Exercises: Sidelying   Hip Abduction Right;Left;20 reps    Hip Abduction Weights (lbs) 2 lbs    Hip Abduction Limitations tactile cues to keep hips forward      Lumbar Exercises: Prone   Straight Leg Raise 10 reps    Straight Leg Raises Limitations 5 reps eccentric    Other Prone Lumbar Exercises Hamstring curl 2x10 with 2 lbs                          PT Long Term Goals - 09/12/21 1040       PT LONG TERM GOAL #1   Title Pt will be independent with his HEP    Time 6    Period Weeks    Status On-going     Target Date 10/09/21      PT LONG TERM GOAL #2   Title Pt will report at least 50% reduction in his LBP    Time 6    Period Weeks    Status On-going    Target Date 10/09/21      PT LONG TERM GOAL #3   Title Pt will be able to lift at least 25 lbs for home tasks (i.e. laundry) utilizing proper form to protect his back    Time 6    Period Weeks    Status On-going    Target Date 10/09/21      PT LONG TERM GOAL #4   Title Pt will demo improved core strength by maintaining posterior pelvic tilt during Bilateral Bent Leg Lift    Baseline Unable to perform this session    Time 6    Period Weeks    Status On-going    Target Date 10/09/21      PT LONG TERM GOAL #5   Title Pt will have improved FOTO score to at least 60    Baseline 57    Time 6    Period Weeks    Status On-going    Target Date 10/09/21                   Plan - 09/25/21 1426     Clinical Impression Statement Continued to work on glute strengthening/hip extension. Continued to progress pt's core strengthening. Pt remains non compliant to his HEP but does report less foot numbness this weekend and back pain was a little less today. Continues to need cues to maintain posture in standing.    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;Bend    [employer];Occupation;Shop;Yard Work;Laundry;Community Activity;Cleaning;Meal Prep    Stability/Clinical Decision Making Evolving/Moderate complexity    Rehab Potential Good    PT Frequency 2x / week    PT Duration 6 weeks    PT Treatment/Interventions ADLs/Self Care Home Management;Aquatic Therapy;Cryotherapy;Electrical Stimulation;Iontophoresis 4mg /ml Dexamethasone;Moist Heat;Traction;DME Instruction;Gait training;Stair training;Functional mobility training;Therapeutic  activities;Therapeutic exercise;Balance training;Neuromuscular re-education;Patient/family education;Manual techniques;Passive range of motion;Dry needling;Taping    PT Next Visit Plan Manual therapy and stretch psoas/hip flexors, QL, and lumbar paraspinals. Check pelvic alignment and address as indicated. Continue hip and core strengthening.  PT Home Exercise Plan Access Code [email or web address]    Consulted and Agree with Plan of Care Patient             Patient will benefit from skilled therapeutic intervention in order to improve the following deficits and impairments:  Abnormal gait, Decreased range of motion, Difficulty walking, Increased fascial restricitons, Decreased endurance, Obesity, Pain, Decreased activity tolerance, Hypomobility, Improper body mechanics, Decreased mobility, Decreased strength, Impaired sensation, Postural dysfunction  Visit Diagnosis: Muscle weakness (generalized)  Chronic bilateral low back pain with right-sided sciatica  Abnormal posture  Difficulty in walking, not elsewhere classified  Other abnormalities of gait and mobility     Problem List Patient Active Problem List   Diagnosis Date Noted   Spinal stenosis of lumbar region 06/19/2017    Class: Chronic   Status post lumbar laminectomy 06/19/2017    [facility] April Dell Ponto, PT, DPT 09/25/2021, 3:29 PM  [facility] [address] [place], [place], [address] Phone: [phone]   Fax:  [phone]  Name: Allen Henry. MRN: [id number] Date of Birth: August 25, 1956

## 2021-09-28 ENCOUNTER — Encounter: Payer: BC Managed Care – PPO | Admitting: Physical Therapy

## 2021-10-03 ENCOUNTER — Ambulatory Visit (INDEPENDENT_AMBULATORY_CARE_PROVIDER_SITE_OTHER): Payer: BC Managed Care – PPO | Admitting: Physical Therapy

## 2021-10-03 ENCOUNTER — Encounter: Payer: Self-pay | Admitting: Physical Therapy

## 2021-10-03 ENCOUNTER — Other Ambulatory Visit: Payer: Self-pay

## 2021-10-03 DIAGNOSIS — R293 Abnormal posture: Secondary | ICD-10-CM

## 2021-10-03 DIAGNOSIS — M6281 Muscle weakness (generalized): Secondary | ICD-10-CM | POA: Diagnosis not present

## 2021-10-03 DIAGNOSIS — G8929 Other chronic pain: Secondary | ICD-10-CM

## 2021-10-03 DIAGNOSIS — M5441 Lumbago with sciatica, right side: Secondary | ICD-10-CM | POA: Diagnosis not present

## 2021-10-03 DIAGNOSIS — R262 Difficulty in walking, not elsewhere classified: Secondary | ICD-10-CM | POA: Diagnosis not present

## 2021-10-03 NOTE — Therapy (Signed)
[place], [place], [address] Phone: [phone]   Fax:  [phone]  Physical Therapy Treatment  Patient Details  Name: Allen Henry. MRN: [id number] Date of Birth: Feb 24, 1956 Referring Provider (PT): Jessy Oto, MD   Encounter Date: 10/03/2021   PT End of Session - 10/03/21 1153     Visit Number 8    Number of Visits 12    Date for PT Re-Evaluation 10/09/21    Authorization Type BCBS    PT Start Time 1151    PT Stop Time 1229    PT Time Calculation (min) 38 min    Activity Tolerance Patient tolerated treatment well    Behavior During Therapy WFL for tasks assessed/performed             Past Medical History:  Diagnosis Date   Arthritis    Headache    Pneumonia    Vertigo     Past Surgical History:  Procedure Laterality Date   COLONOSCOPY     LUMBAR LAMINECTOMY N/A 06/19/2017   Procedure: RIGHT LUMBAR THREE-FOUE AND LUMBAR FIVE-SACRAL ONE  PARTIAL HEMILAMINECTOMY, BILATERAL PARTIAL HEMILAMINECTOMY LUMBAR FOUR-FIVE;  Surgeon: Jessy Oto, MD;  Location: [facility];  Service: Orthopedics;  Laterality: N/A;  RIGHT Lumbar three-four AND Lumbar five-Sacrum one  PARTIAL HEMILAMINECTOMY, BILATERAL PARTIAL HEMILAMINECTOMY Lumbar four-five    TONSILLECTOMY      There were no vitals filed for this visit.   Subjective Assessment - 10/03/21 1153     Subjective Pt reports that somedays is back is "ok".  If he sits a long time, his back bothers. He continues to have difficulty completing HEP due to staying busy with errands and chores.    Pertinent History Prior back surgery in 2018    How long can you walk comfortably? can get around grocery store bending over the cart despite foot numbness    Currently in Pain? Yes    Pain Score 3     Pain Location Leg    Pain Orientation Right;Left    Pain Descriptors / Indicators Aching    Aggravating Factors  prolonged sitting.    Pain  Relieving Factors ?                [facility] PT Assessment - 10/03/21 0001       Assessment   Medical Diagnosis M51.36 (ICD-10-CM) - Degenerative disc disease, lumbar  M43.16 (ICD-10-CM) - Spondylolisthesis, lumbar region  M48.061 (ICD-10-CM) - Spinal stenosis of lumbar region without neurogenic claudication  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, lumbar region    Referring Provider (PT) Jessy Oto, MD    Onset Date/Surgical Date --   Plans for surgery next year   Next MD Visit 10/12/21    Prior Therapy 4 years ago after back surgery (nerve decompression?)      Observation/Other Assessments   Focus on Therapeutic Outcomes (FOTO)  54 functional score : goal of 60             [facility] Adult PT Treatment/Exercise - 10/03/21 0001       Lumbar Exercises: Stretches   Passive Hamstring Stretch Right;Left;3 reps;10 seconds   supine with strap assist   Lower Trunk Rotation 4 reps;10 seconds   wide feet   Other Lumbar Stretch Exercise Sidelying open/close book x3 each side      Lumbar Exercises: Aerobic   Nustep L4 UEs/ LEs x 6 min    Other  Aerobic Exercise single laps around gym to assess response to exercises.      Lumbar Exercises: Standing   Row Strengthening;Both;15 reps    Theraband Level (Row) Level 4 (Blue)    Shoulder Extension Strengthening;Theraband;Both;10 reps    Theraband Level (Shoulder Extension) Level 4 (Blue)    Other Standing Lumbar Exercises hip ext and hip abdct alternating legs x 12 reps with counter support.    Other Standing Lumbar Exercises anti-rotation with forward press, green band x 10 reps each side.      Lumbar Exercises: Seated   Sit to Stand 5 reps    Sit to Stand Limitations with hip hinge, cues to slow speed.      Lumbar Exercises: Supine   Bent Knee Raise 10 reps    Bridge 10 reps                          PT Long Term Goals - 10/03/21 1223       PT LONG TERM GOAL #1   Title Pt will be  independent with his HEP    Time 6    Period Weeks    Status On-going    Target Date 10/09/21      PT LONG TERM GOAL #2   Title Pt will report at least 50% reduction in his LBP    Baseline varies day to day.    Time 6    Period Weeks    Status Partially Met    Target Date 10/09/21      PT LONG TERM GOAL #3   Title Pt will be able to lift at least 25 lbs for home tasks (i.e. laundry) utilizing proper form to protect his back    Baseline *unable to complete due to 10# lift restriction    Time 6    Period Weeks    Status On-going    Target Date 10/09/21      PT LONG TERM GOAL #4   Title Pt will demo improved core strength by maintaining posterior pelvic tilt during Bilateral Bent Leg Lift    Time 6    Period Weeks    Status Achieved    Target Date 10/09/21      PT LONG TERM GOAL #5   Title Pt will have improved FOTO score to at least 60    Baseline 57 at intake, 54 on 10/03/21    Time 6    Period Weeks    Status On-going    Target Date 10/09/21                   Plan - 10/03/21 1207     Clinical Impression Statement Trialed some alternate exercises to assist with improved compliance of HEP.  He reported minor/intermittent numbness in lateral dorsal surface of Rt foot. Othewise, all other exercises tolerated well without increase in back/ LE pain . Pt reporting overall improvement in back pain, however FOTO score decreased.  Pt has partially met his goals.  Returns to MD next week.    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[employer];Occupation;Shop;Yard Work;Laundry;Community Activity;Cleaning;Meal Prep    Stability/Clinical Decision Making Evolving/Moderate complexity    Rehab Potential Good    PT Frequency 2x / week    PT Duration 6 weeks    PT  Treatment/Interventions ADLs/Self Care Home Management;Aquatic Therapy;Cryotherapy;Electrical Stimulation;Iontophoresis 32m/ml Dexamethasone;Moist  Heat;Traction;DME Instruction;Gait training;Stair training;Functional mobility training;Therapeutic activities;Therapeutic exercise;Balance training;Neuromuscular re-education;Patient/family education;Manual techniques;Passive range of motion;Dry needling;Taping    PT Next Visit Plan revise HEP to assist with compliance (unable to use bands due to old house with limited structurs to safely secure). MD note.  Discuss continuation vs d/c or hold.    PT Home Exercise Plan Access Code [email or web address]    Consulted and Agree with Plan of Care Patient             Patient will benefit from skilled therapeutic intervention in order to improve the following deficits and impairments:  Abnormal gait, Decreased range of motion, Difficulty walking, Increased fascial restricitons, Decreased endurance, Obesity, Pain, Decreased activity tolerance, Hypomobility, Improper body mechanics, Decreased mobility, Decreased strength, Impaired sensation, Postural dysfunction  Visit Diagnosis: Muscle weakness (generalized)  Chronic bilateral low back pain with right-sided sciatica  Abnormal posture  Difficulty in walking, not elsewhere classified     Problem List Patient Active Problem List   Diagnosis Date Noted   Spinal stenosis of lumbar region 06/19/2017    Class: Chronic   Status post lumbar laminectomy 06/19/2017   Kerin Perna, PTA 10/03/21 12:52 PM   [place], [place], [address] Phone: [phone]   Fax:  [phone]  Name: Allen Henry. MRN: [id number] Date of Birth: Sep 17, 1956

## 2021-10-05 ENCOUNTER — Other Ambulatory Visit: Payer: Self-pay

## 2021-10-05 ENCOUNTER — Ambulatory Visit (INDEPENDENT_AMBULATORY_CARE_PROVIDER_SITE_OTHER): Payer: BC Managed Care – PPO | Admitting: Physical Therapy

## 2021-10-05 DIAGNOSIS — R2689 Other abnormalities of gait and mobility: Secondary | ICD-10-CM

## 2021-10-05 DIAGNOSIS — R293 Abnormal posture: Secondary | ICD-10-CM | POA: Diagnosis not present

## 2021-10-05 DIAGNOSIS — M5441 Lumbago with sciatica, right side: Secondary | ICD-10-CM | POA: Diagnosis not present

## 2021-10-05 DIAGNOSIS — R262 Difficulty in walking, not elsewhere classified: Secondary | ICD-10-CM | POA: Diagnosis not present

## 2021-10-05 DIAGNOSIS — G8929 Other chronic pain: Secondary | ICD-10-CM

## 2021-10-05 DIAGNOSIS — M6281 Muscle weakness (generalized): Secondary | ICD-10-CM

## 2021-10-05 NOTE — Therapy (Signed)
[place], [place], [address] Phone: [phone]   Fax:  [phone]  Physical Therapy Treatment and Hold  Patient Details  Name: Allen Henry. MRN: [id number] Date of Birth: [birth year] Referring Provider (PT): Jessy Oto, MD   Encounter Date: 10/05/2021   PT End of Session - 10/05/21 1402     Visit Number 9    Number of Visits 12    Date for PT Re-Evaluation 10/09/21    Authorization Type BCBS    PT Start Time 1287    PT Stop Time 1445    PT Time Calculation (min) 43 min    Activity Tolerance Patient tolerated treatment well    Behavior During Therapy WFL for tasks assessed/performed             Past Medical History:  Diagnosis Date   Arthritis    Headache    Pneumonia    Vertigo     Past Surgical History:  Procedure Laterality Date   COLONOSCOPY     LUMBAR LAMINECTOMY N/A 06/19/2017   Procedure: RIGHT LUMBAR THREE-FOUE AND LUMBAR FIVE-SACRAL ONE  PARTIAL HEMILAMINECTOMY, BILATERAL PARTIAL HEMILAMINECTOMY LUMBAR FOUR-FIVE;  Surgeon: Jessy Oto, MD;  Location: [facility];  Service: Orthopedics;  Laterality: N/A;  RIGHT Lumbar three-four AND Lumbar five-Sacrum one  PARTIAL HEMILAMINECTOMY, BILATERAL PARTIAL HEMILAMINECTOMY Lumbar four-five    TONSILLECTOMY      There were no vitals filed for this visit.   Subjective Assessment - 10/05/21 1407     Subjective Pt reports some problems with his wife. Pt reports back pain is good today. Reports no numbness currently. "I guess PT is helping."    Pertinent History Prior back surgery in 2018    How long can you sit comfortably? Sometimes 5-10 min; sometimes a couple of hours    How long can you stand comfortably? minutes or hours    How long can you walk comfortably? can get around grocery store bending over the cart despite foot numbness    Patient Stated Goals Decrease pain and numbness    Currently in Pain? Yes    Pain  Score 2     Pain Location Back                [facility] PT Assessment - 10/05/21 0001       Assessment   Medical Diagnosis M51.36 (ICD-10-CM) - Degenerative disc disease, lumbar  M43.16 (ICD-10-CM) - Spondylolisthesis, lumbar region  M48.061 (ICD-10-CM) - Spinal stenosis of lumbar region without neurogenic claudication  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, lumbar region    Referring Provider (PT) Jessy Oto, MD    Next MD Visit 10/12/21    Prior Therapy 4 years ago after back surgery (nerve decompression?)                           [facility] Adult PT Treatment/Exercise - 10/05/21 0001       Lumbar Exercises: Stretches   Passive Hamstring Stretch Right;Left;30 seconds    Lower Trunk Rotation 3 reps;20 seconds      Lumbar Exercises: Aerobic   Nustep L5 x UEs/LEs x 6 min      Lumbar Exercises: Standing   Other Standing Lumbar Exercises yellow weighted ball diagonals x10 each way    Other Standing Lumbar Exercises anti-rotation with forward press, green band x 10 reps each side.      Lumbar  Exercises: Seated   Other Seated Lumbar Exercises PPT with hip flexion 2x10      Lumbar Exercises: Supine   Bent Knee Raise 10 reps   double knee   Bridge 10 reps      Lumbar Exercises: Sidelying   Hip Abduction Right;Left;20 reps    Hip Abduction Limitations tactile cues to keep hips forward                          PT Long Term Goals - 10/05/21 1417       PT LONG TERM GOAL #1   Title Pt will be independent with his HEP    Time 6    Period Weeks    Status Not Met    Target Date 10/09/21      PT LONG TERM GOAL #2   Title Pt will report at least 50% reduction in his LBP    Baseline varies day to day; 40-60% improvement 12/29    Time 6    Period Weeks    Status Partially Met    Target Date 10/09/21      PT LONG TERM GOAL #3   Title Pt will be able to lift at least 25 lbs for home tasks (i.e. laundry) utilizing  proper form to protect his back    Baseline *unable to complete due to 10# lift restriction    Time 6    Period Weeks    Status Deferred    Target Date 10/09/21      PT LONG TERM GOAL #4   Title Pt will demo improved core strength by maintaining posterior pelvic tilt during Bilateral Bent Leg Lift    Time 6    Period Weeks    Status Achieved    Target Date 10/09/21      PT LONG TERM GOAL #5   Title Pt will have improved FOTO score to at least 60    Baseline 57 at intake, 54 on 10/03/21    Time 6    Period Weeks    Status On-going    Target Date 10/09/21                   Plan - 10/05/21 1436     Clinical Impression Statement Continued to work on LE strengthening and core stabilization. Pt with limited compliance to HEP as he feels he does enough activity helping his wife. Reinforced with pt the difference between activity vs exercise and that he would benefit from a more regimented exercise program. At this time will defer his PT until further recommendations from his doctor. Pt has partially met his goals except FOTO.    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[employer];Occupation;Shop;Yard Work;Laundry;Community Activity;Cleaning;Meal Prep    Stability/Clinical Decision Making Evolving/Moderate complexity    Rehab Potential Good    PT Frequency 2x / week    PT Duration 6 weeks    PT Treatment/Interventions ADLs/Self Care Home Management;Aquatic Therapy;Cryotherapy;Electrical Stimulation;Iontophoresis 3m/ml Dexamethasone;Moist Heat;Traction;DME Instruction;Gait training;Stair training;Functional mobility training;Therapeutic activities;Therapeutic exercise;Balance training;Neuromuscular re-education;Patient/family education;Manual techniques;Passive range of  motion;Dry needling;Taping    PT Next Visit Plan revise HEP to assist with compliance (unable to use bands due to old house with limited structurs to safely secure). MD note.  Discuss continuation vs d/c or hold.    PT Home Exercise Plan Access Code [email or web address]  Consulted and Agree with Plan of Care Patient             Patient will benefit from skilled therapeutic intervention in order to improve the following deficits and impairments:  Abnormal gait, Decreased range of motion, Difficulty walking, Increased fascial restricitons, Decreased endurance, Obesity, Pain, Decreased activity tolerance, Hypomobility, Improper body mechanics, Decreased mobility, Decreased strength, Impaired sensation, Postural dysfunction  Visit Diagnosis: Muscle weakness (generalized)  Chronic bilateral low back pain with right-sided sciatica  Abnormal posture  Difficulty in walking, not elsewhere classified  Other abnormalities of gait and mobility     Problem List Patient Active Problem List   Diagnosis Date Noted   Spinal stenosis of lumbar region 06/19/2017    Class: Chronic   Status post lumbar laminectomy 06/19/2017    [facility] April Gordy Levan, PT, DPT 10/05/2021, 2:45 PM  [facility] [place], [place], [address] Phone: [phone]   Fax:  [phone]  Name: Marshel Golubski. MRN: [id number] Date of Birth: 1956-02-23

## 2021-10-12 ENCOUNTER — Encounter: Payer: Self-pay | Admitting: Specialist

## 2021-10-12 ENCOUNTER — Ambulatory Visit (INDEPENDENT_AMBULATORY_CARE_PROVIDER_SITE_OTHER): Payer: Medicare Other | Admitting: Specialist

## 2021-10-12 ENCOUNTER — Other Ambulatory Visit: Payer: Self-pay

## 2021-10-12 VITALS — BP 150/92 | HR 94 | Ht 70.0 in | Wt 299.0 lb

## 2021-10-12 DIAGNOSIS — M48061 Spinal stenosis, lumbar region without neurogenic claudication: Secondary | ICD-10-CM | POA: Diagnosis not present

## 2021-10-12 DIAGNOSIS — M4316 Spondylolisthesis, lumbar region: Secondary | ICD-10-CM

## 2021-10-12 DIAGNOSIS — M5136 Other intervertebral disc degeneration, lumbar region: Secondary | ICD-10-CM

## 2021-10-12 DIAGNOSIS — M160 Bilateral primary osteoarthritis of hip: Secondary | ICD-10-CM

## 2021-10-12 DIAGNOSIS — M47816 Spondylosis without myelopathy or radiculopathy, lumbar region: Secondary | ICD-10-CM

## 2021-10-12 NOTE — Patient Instructions (Signed)
Plan: Avoid frequent bending and stooping  No lifting greater than 10 lbs. May use ice or moist heat for pain. Weight loss is of benefit. Best medication for lumbar disc disease is arthritis medications like motrin, celebrex and naprosyn. Exercise is important to improve your indurance and does allow people to function better inspite of back pain.  Therapy and if it is not successful then fusion of the segments that are painful would be a consideration.

## 2021-10-12 NOTE — Progress Notes (Signed)
Office Visit Note   Patient: Allen Henry.           Date of Birth: 1956-01-20           MRN: [id number] Visit Date: 10/12/2021              Requested by: Marvis Repress, MD No address on file PCP: Marvis Repress, MD   Assessment & Plan: Visit Diagnoses:  1. Spondylolisthesis, lumbar region   2. Bilateral primary osteoarthritis of hip   3. Degenerative disc disease, lumbar   4. Spinal stenosis of lumbar region without neurogenic claudication   5. Spondylosis without myelopathy or radiculopathy, lumbar region     Plan: Plan: Avoid frequent bending and stooping  No lifting greater than 10 lbs. May use ice or moist heat for pain. Weight loss is of benefit. Best medication for lumbar disc disease is arthritis medications like motrin, celebrex and naprosyn. Exercise is important to improve your indurance and does allow people to function better inspite of back pain.  Therapy and if it is not successful then fusion of the segments that are painful would be a consideration.    Follow-Up Instructions: No follow-ups on file.   Orders:  No orders of the defined types were placed in this encounter.  No orders of the defined types were placed in this encounter.     Procedures: No procedures performed   Clinical Data: No additional findings.   Subjective: Chief Complaint  Patient presents with   Lower Back - Follow-up    Did PT not sure if it helped any or not    66 year old male with history of lumbar laminectomy surgery for spinal stenosis and has persistent pain with low grade anterolisthesis at L4-5 84mm and spondylosis and DDD. He is retired and is taking mobic, gabapentin and robaxin. He has pain with prolong standing and walking. PT is helping to decrease the Pain.   Review of Systems  Constitutional: Negative.   HENT: Negative.    Eyes: Negative.   Respiratory: Negative.    Cardiovascular: Negative.   Gastrointestinal: Negative.    Endocrine: Negative.   Genitourinary: Negative.   Musculoskeletal: Negative.   Skin: Negative.   Allergic/Immunologic: Negative.   Neurological: Negative.   Hematological: Negative.   Psychiatric/Behavioral: Negative.      Objective: Vital Signs: BP (!) 150/92 (BP Location: Left Arm, Patient Position: Sitting)    Pulse 94    Ht 5\' 10"  (1.778 m)    Wt 299 lb (135.6 kg)    BMI 42.90 kg/m   Physical Exam Constitutional:      Appearance: He is well-developed.  HENT:     Head: Normocephalic and atraumatic.  Eyes:     Pupils: Pupils are equal, round, and reactive to light.  Pulmonary:     Effort: Pulmonary effort is normal.     Breath sounds: Normal breath sounds.  Abdominal:     General: Bowel sounds are normal.     Palpations: Abdomen is soft.  Musculoskeletal:     Cervical back: Normal range of motion and neck supple.     Lumbar back: Negative right straight leg raise test and negative left straight leg raise test.  Skin:    General: Skin is warm and dry.  Neurological:     Mental Status: He is alert and oriented to person, place, and time.  Psychiatric:        Behavior: Behavior normal.  Thought Content: Thought content normal.        Judgment: Judgment normal.    Back Exam   Tenderness  The patient is experiencing tenderness in the lumbar.  Range of Motion  Extension:  abnormal  Flexion:  abnormal  Lateral bend right:  normal  Lateral bend left:  normal  Rotation right:  normal  Rotation left:  normal   Muscle Strength  The patient has normal back strength. Right Quadriceps:  5/5  Left Quadriceps:  5/5  Right Hamstrings:  5/5  Left Hamstrings:  5/5   Tests  Straight leg raise right: negative Straight leg raise left: negative  Reflexes  Patellar:  1/4 Achilles:  1/4 Babinski's sign: normal      Specialty Comments:  No specialty comments available.  Imaging: No results found.   PMFS History: Patient Active Problem List   Diagnosis  Date Noted   Spinal stenosis of lumbar region 06/19/2017    Priority: High    Class: Chronic   Status post lumbar laminectomy 06/19/2017   Past Medical History:  Diagnosis Date   Arthritis    Headache    Pneumonia    Vertigo     Family History  Problem Relation Age of Onset   Heart attack Father    Cancer Father    Cancer Mother     Past Surgical History:  Procedure Laterality Date   COLONOSCOPY     LUMBAR LAMINECTOMY N/A 06/19/2017   Procedure: RIGHT LUMBAR THREE-FOUE AND LUMBAR FIVE-SACRAL ONE  PARTIAL HEMILAMINECTOMY, BILATERAL PARTIAL HEMILAMINECTOMY LUMBAR FOUR-FIVE;  Surgeon: Kerrin Champagne, MD;  Location: [facility];  Service: Orthopedics;  Laterality: N/A;  RIGHT Lumbar three-four AND Lumbar five-Sacrum one  PARTIAL HEMILAMINECTOMY, BILATERAL PARTIAL HEMILAMINECTOMY Lumbar four-five    TONSILLECTOMY     Social History   Occupational History   Not on file  Tobacco Use   Smoking status: Former    Types: Cigarettes    Quit date: 06/12/2002    Years since quitting: 19.3   Smokeless tobacco: Never  Vaping Use   Vaping Use: Never used  Substance and Sexual Activity   Alcohol use: No   Drug use: No    Types: PCP   Sexual activity: Not on file

## 2021-10-17 ENCOUNTER — Encounter: Payer: BC Managed Care – PPO | Admitting: Physical Therapy

## 2021-10-19 ENCOUNTER — Ambulatory Visit: Payer: BC Managed Care – PPO | Admitting: Physical Therapy

## 2021-10-24 ENCOUNTER — Ambulatory Visit: Payer: BC Managed Care – PPO | Admitting: Physical Therapy

## 2021-10-26 ENCOUNTER — Other Ambulatory Visit: Payer: Self-pay

## 2021-10-26 ENCOUNTER — Ambulatory Visit: Payer: Medicare Other | Attending: Specialist | Admitting: Physical Therapy

## 2021-10-26 DIAGNOSIS — M5441 Lumbago with sciatica, right side: Secondary | ICD-10-CM | POA: Insufficient documentation

## 2021-10-26 DIAGNOSIS — G8929 Other chronic pain: Secondary | ICD-10-CM

## 2021-10-26 DIAGNOSIS — M6281 Muscle weakness (generalized): Secondary | ICD-10-CM | POA: Diagnosis not present

## 2021-10-26 DIAGNOSIS — R2689 Other abnormalities of gait and mobility: Secondary | ICD-10-CM | POA: Insufficient documentation

## 2021-10-26 DIAGNOSIS — R262 Difficulty in walking, not elsewhere classified: Secondary | ICD-10-CM | POA: Insufficient documentation

## 2021-10-26 DIAGNOSIS — R293 Abnormal posture: Secondary | ICD-10-CM | POA: Diagnosis not present

## 2021-10-26 NOTE — Therapy (Signed)
[place], [place], [address] Phone: [phone]   Fax:  [phone]  Physical Therapy Treatment and Re-Certification  Patient Details  Name: Allen Henry. MRN: [id number] Date of Birth: [birth year] Referring Provider (PT): Jessy Oto, MD   Encounter Date: 10/26/2021   PT End of Session - 10/26/21 1401     Visit Number 10    Number of Visits 24    Date for PT Re-Evaluation 10/09/21    Authorization Type BCBS    PT Start Time 1401    PT Stop Time 1445    PT Time Calculation (min) 44 min    Activity Tolerance Patient tolerated treatment well    Behavior During Therapy WFL for tasks assessed/performed             Past Medical History:  Diagnosis Date   Arthritis    Headache    Pneumonia    Vertigo     Past Surgical History:  Procedure Laterality Date   COLONOSCOPY     LUMBAR LAMINECTOMY N/A 06/19/2017   Procedure: RIGHT LUMBAR THREE-FOUE AND LUMBAR FIVE-SACRAL ONE  PARTIAL HEMILAMINECTOMY, BILATERAL PARTIAL HEMILAMINECTOMY LUMBAR FOUR-FIVE;  Surgeon: Jessy Oto, MD;  Location: [facility];  Service: Orthopedics;  Laterality: N/A;  RIGHT Lumbar three-four AND Lumbar five-Sacrum one  PARTIAL HEMILAMINECTOMY, BILATERAL PARTIAL HEMILAMINECTOMY Lumbar four-five    TONSILLECTOMY      There were no vitals filed for this visit.   Subjective Assessment - 10/26/21 1401     Subjective "I think I pulled something in my back doing so much moving for my refrigerator but it's getting better." He notes that he is getting less numbness in his right foot. Pt states that Dr. Louanne Skye recommended he continue therapy for a few more weeks.    Pertinent History Prior back surgery in 2018    How long can you sit comfortably? Sometimes 5-10 min; sometimes a couple of hours    How long can you stand comfortably? minutes or hours    How long can you walk comfortably? can get around grocery store  bending over the cart despite foot numbness    Diagnostic tests MRI 10/28: L3-4: Previous right hemilaminectomy. Mild right foraminal narrowing due to encroachment by osteophytes but without definite compression of the exiting L3 nerve.  L4-5: Previous posterior decompression. Bilateral facet arthropathy  with 1 mm of anterolisthesis. Bulging of the disc. Bilateral  foraminal narrowing right more than left potential to affect the  exiting L4 nerves.  L5-S1: Previous posterior decompression. Sufficient patency of the  canal and foramina.    Patient Stated Goals Decrease pain and numbness    Currently in Pain? 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, lumbar region    Referring Provider (PT) Jessy Oto, MD                           [facility] Adult PT Treatment/Exercise - 10/26/21 0001  Lumbar Exercises: Stretches   Passive Hamstring Stretch Right;Left;30 seconds    Prone Mid Back Stretch 60 seconds    Quad Stretch 30 seconds    ITB Stretch 30 seconds    Other Lumbar Stretch Exercise butterfly stretch x30 sec      Lumbar Exercises: Supine   Ab Set 10 reps;5 seconds    AB Set Limitations with pball press down    Bent Knee Raise 10 reps    Bent Knee Raise Limitations alternating    Dead Bug 10 reps    Dead Bug Limitations alternating    Bridge 10 reps      Lumbar Exercises: Sidelying   Hip Abduction Right;Left;20 reps      Lumbar Exercises: Prone   Straight Leg Raise 20 reps                          PT Long Term Goals - 10/26/21 1820       PT LONG  TERM GOAL #1   Title Pt will be independent with his HEP    Time 6    Period Weeks    Status Not Met    Target Date 12/07/21      PT LONG TERM GOAL #2   Title Pt will report at least 50% reduction in his LBP    Baseline varies day to day; 40-60% improvement 12/29    Time 6    Period Weeks    Status Partially Met    Target Date 12/07/21      PT LONG TERM GOAL #3   Title Pt will be able to squat 10 lbs with good form utilizing LEs and not bending with his back    Time 6    Period Weeks    Status New    Target Date 12/07/21      PT LONG TERM GOAL #4   Title Pt will demo improved core strength by maintaining posterior pelvic tilt during Bilateral Bent Leg Lift    Time 6    Period Weeks    Status Achieved    Target Date 10/09/21      PT LONG TERM GOAL #5   Title Pt will have improved FOTO score to at least 60    Baseline 57 at intake, 54 on 10/03/21    Time 6    Period Weeks    Status On-going    Target Date 12/07/21                   Plan - 10/26/21 1434     Clinical Impression Statement Re-certification for pt to continue PT. Pt with improving hip ROM but remains highly limited with hip extension. Continued to work on core stabilizatoin this session. Provided manual therapy and stretching to address exacerbation of his low back pain after prolonged bending over the refrigerator.    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[employer];Occupation;Shop;Yard Work;Laundry;Community Activity;Cleaning;Meal Prep    Stability/Clinical Decision Making Evolving/Moderate complexity    Rehab Potential Good    PT Frequency 2x / week    PT Duration 6 weeks    PT Treatment/Interventions ADLs/Self Care Home Management;Aquatic Therapy;Cryotherapy;Electrical  Stimulation;Iontophoresis 60m/ml Dexamethasone;Moist Heat;Traction;DME Instruction;Gait training;Stair training;Functional mobility training;Therapeutic activities;Therapeutic exercise;Balance training;Neuromuscular re-education;Patient/family education;Manual techniques;Passive range of motion;Dry needling;Taping    PT Next Visit Plan revise HEP to assist with compliance (unable  to use bands due to old house with limited structurs to safely secure). COntinue core stabilization and hip strengthening.    PT Home Exercise Plan Access Code [email or web address]    Consulted and Agree with Plan of Care Patient             Patient will benefit from skilled therapeutic intervention in order to improve the following deficits and impairments:  Abnormal gait, Decreased range of motion, Difficulty walking, Increased fascial restricitons, Decreased endurance, Obesity, Pain, Decreased activity tolerance, Hypomobility, Improper body mechanics, Decreased mobility, Decreased strength, Impaired sensation, Postural dysfunction  Visit Diagnosis: Muscle weakness (generalized)  Chronic bilateral low back pain with right-sided sciatica  Abnormal posture  Difficulty in walking, not elsewhere classified  Other abnormalities of gait and mobility     Problem List Patient Active Problem List   Diagnosis Date Noted   Spinal stenosis of lumbar region 06/19/2017    Class: Chronic   Status post lumbar laminectomy 06/19/2017    [facility] April Gordy Levan, PT, DPT 10/26/2021, 6:21 PM  [facility] [place], [place], [address] Phone: [phone]   Fax:  [phone]  Name: Allen Henry. MRN: [id number] Date of Birth: 1955-11-30

## 2021-10-31 ENCOUNTER — Other Ambulatory Visit: Payer: Self-pay

## 2021-10-31 ENCOUNTER — Encounter: Payer: Self-pay | Admitting: Physical Therapy

## 2021-10-31 ENCOUNTER — Ambulatory Visit: Payer: Medicare Other | Admitting: Physical Therapy

## 2021-10-31 DIAGNOSIS — M6281 Muscle weakness (generalized): Secondary | ICD-10-CM | POA: Diagnosis not present

## 2021-10-31 DIAGNOSIS — R293 Abnormal posture: Secondary | ICD-10-CM

## 2021-10-31 DIAGNOSIS — G8929 Other chronic pain: Secondary | ICD-10-CM

## 2021-10-31 NOTE — Therapy (Signed)
[place], [place], [address] Phone: [phone]   Fax:  [phone]  Physical Therapy Treatment  Patient Details  Name: Allen Henry. MRN: [id number] Date of Birth: 1956-08-02 Referring Provider (PT): Jessy Oto, MD   Encounter Date: 10/31/2021   PT End of Session - 10/31/21 1021     Visit Number 11    Number of Visits 24    Authorization Type BCBS    PT Start Time 1019    PT Stop Time 1100    PT Time Calculation (min) 41 min    Activity Tolerance Patient tolerated treatment well    Behavior During Therapy WFL for tasks assessed/performed             Past Medical History:  Diagnosis Date   Arthritis    Headache    Pneumonia    Vertigo     Past Surgical History:  Procedure Laterality Date   COLONOSCOPY     LUMBAR LAMINECTOMY N/A 06/19/2017   Procedure: RIGHT LUMBAR THREE-FOUE AND LUMBAR FIVE-SACRAL ONE  PARTIAL HEMILAMINECTOMY, BILATERAL PARTIAL HEMILAMINECTOMY LUMBAR FOUR-FIVE;  Surgeon: Jessy Oto, MD;  Location: [facility];  Service: Orthopedics;  Laterality: N/A;  RIGHT Lumbar three-four AND Lumbar five-Sacrum one  PARTIAL HEMILAMINECTOMY, BILATERAL PARTIAL HEMILAMINECTOMY Lumbar four-five    TONSILLECTOMY      There were no vitals filed for this visit.   Subjective Assessment - 10/31/21 1021     Subjective Pt reports that his back doesn't feel too bad today.  "I don't think back or knee will ever be good again".    Patient Stated Goals Decrease pain and numbness    Currently in Pain? Yes    Pain Score 3     Pain Location Back    Pain Orientation Left;Right    Pain Descriptors / Indicators Aching    Aggravating Factors  prolonged sitting    Pain Relieving Factors ?                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      Lumbar Exercises: Stretches   Passive Hamstring Stretch Right;Left;2 reps;20 seconds   seated with hip hinge   Lower Trunk Rotation 4 reps;10 seconds    Other Lumbar Stretch Exercise seated with forward trunk flexion, rolling red pball forward and side to side x 5 each, 5-10 sec      Lumbar Exercises: Aerobic   Nustep L5 x UEs/LEs x 6 min      Lumbar Exercises: Standing   Lifting 10 reps   KB from 8" step   Lifting Weights (lbs) 10    Row Strengthening;Both;10 reps    Theraband Level (Row) Level 4 (Blue)   4 sec pause in retraction  Row Limitations also trial of reverse wall push up x 5    Other Standing Lumbar Exercises anti-rotation with forward press, green band x 10 reps each side.   High knee marching with 6# wt held at shoulder level, core engaged.      Lumbar Exercises: Sidelying   Other Sidelying Lumbar Exercises open book x 3 reps each side.              PT Long Term Goals - 10/26/21 1820       PT LONG TERM GOAL #1   Title Pt will be independent with his HEP    Time 6    Period Weeks    Status Not Met    Target Date 12/07/21      PT LONG TERM GOAL #2   Title Pt will report at least 50% reduction in his LBP    Baseline varies day to day; 40-60% improvement 12/29    Time 6    Period Weeks    Status Partially Met    Target Date 12/07/21      PT LONG TERM GOAL #3   Title Pt will be able to squat 10 lbs with good form utilizing LEs and not bending with his back     Time 6    Period Weeks    Status New    Target Date 12/07/21      PT LONG TERM GOAL #4   Title Pt will demo improved core strength by maintaining posterior pelvic tilt during Bilateral Bent Leg Lift    Time 6    Period Weeks    Status Achieved    Target Date 10/09/21      PT LONG TERM GOAL #5   Title Pt will have improved FOTO score to at least 60    Baseline 57 at intake, 54 on 10/03/21    Time 6    Period Weeks    Status On-going    Target Date 12/07/21                   Plan - 10/31/21 1104     Clinical Impression Statement Pt tolerated strengthening and stretches well, without any increase in symptoms.  He requires moderate cues for form with squats lifting 10# from elevated surface.  Pt will benefit from continued PT intervention to maximize functional mobility with less pain.  Pt progressing gradually towards remaining goals.    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[employer];Occupation;Shop;Yard Work;Laundry;Community Activity;Cleaning;Meal Prep    Stability/Clinical Decision Making Evolving/Moderate complexity    Rehab Potential Good    PT Frequency 2x / week    PT Duration 6 weeks    PT Treatment/Interventions ADLs/Self Care Home Management;Aquatic Therapy;Cryotherapy;Electrical Stimulation;Iontophoresis 41m/ml Dexamethasone;Moist Heat;Traction;DME Instruction;Gait training;Stair training;Functional mobility training;Therapeutic activities;Therapeutic exercise;Balance training;Neuromuscular re-education;Patient/family education;Manual techniques;Passive range of motion;Dry needling;Taping    PT Next Visit Plan revise HEP to assist with compliance (unable to use bands due to old house with limited structurs to safely secure). Continue core stabilization and  hip strengthening.    PT Home Exercise Plan Access Code [email or web address]    Consulted and Agree with Plan of Care Patient             Patient will benefit from skilled therapeutic intervention in order to improve the following deficits and impairments:  Abnormal gait, Decreased range of motion, Difficulty walking, Increased fascial restricitons,  Decreased endurance, Obesity, Pain, Decreased activity tolerance, Hypomobility, Improper body mechanics, Decreased mobility, Decreased strength, Impaired sensation, Postural dysfunction  Visit Diagnosis: Muscle weakness (generalized)  Chronic bilateral low back pain with right-sided sciatica  Abnormal posture     Problem List Patient Active Problem List   Diagnosis Date Noted   Spinal stenosis of lumbar region 06/19/2017    Class: Chronic   Status post lumbar laminectomy 06/19/2017   Kerin Perna, PTA 10/31/21 11:25 AM  [place], [place], [address] Phone: [phone]   Fax:  [phone]  Name: Jermanie Minshall. MRN: [id number] Date of Birth: May 13, 1956

## 2021-11-02 ENCOUNTER — Ambulatory Visit: Payer: Medicare Other | Admitting: Physical Therapy

## 2021-11-02 ENCOUNTER — Other Ambulatory Visit: Payer: Self-pay

## 2021-11-02 DIAGNOSIS — M6281 Muscle weakness (generalized): Secondary | ICD-10-CM | POA: Diagnosis not present

## 2021-11-02 DIAGNOSIS — R2689 Other abnormalities of gait and mobility: Secondary | ICD-10-CM

## 2021-11-02 DIAGNOSIS — R262 Difficulty in walking, not elsewhere classified: Secondary | ICD-10-CM

## 2021-11-02 DIAGNOSIS — M5441 Lumbago with sciatica, right side: Secondary | ICD-10-CM

## 2021-11-02 DIAGNOSIS — G8929 Other chronic pain: Secondary | ICD-10-CM

## 2021-11-02 DIAGNOSIS — R293 Abnormal posture: Secondary | ICD-10-CM

## 2021-11-02 NOTE — Therapy (Signed)
[place], [place], [address] Phone: [phone]   Fax:  [phone]  Physical Therapy Treatment  Patient Details  Name: Allen Henry. MRN: [id number] Date of Birth: 1956/04/08 Referring Provider (PT): Jessy Oto, MD   Encounter Date: 11/02/2021   PT End of Session - 11/02/21 1359     Visit Number 12    Number of Visits 24    Date for PT Re-Evaluation 12/07/21    Authorization Type BCBS    PT Start Time 1400    PT Stop Time 1445    PT Time Calculation (min) 45 min    Activity Tolerance Patient tolerated treatment well    Behavior During Therapy WFL for tasks assessed/performed             Past Medical History:  Diagnosis Date   Arthritis    Headache    Pneumonia    Vertigo     Past Surgical History:  Procedure Laterality Date   COLONOSCOPY     LUMBAR LAMINECTOMY N/A 06/19/2017   Procedure: RIGHT LUMBAR THREE-FOUE AND LUMBAR FIVE-SACRAL ONE  PARTIAL HEMILAMINECTOMY, BILATERAL PARTIAL HEMILAMINECTOMY LUMBAR FOUR-FIVE;  Surgeon: Jessy Oto, MD;  Location: [facility];  Service: Orthopedics;  Laterality: N/A;  RIGHT Lumbar three-four AND Lumbar five-Sacrum one  PARTIAL HEMILAMINECTOMY, BILATERAL PARTIAL HEMILAMINECTOMY Lumbar four-five    TONSILLECTOMY      There were no vitals filed for this visit.   Subjective Assessment - 11/02/21 1402     Subjective Pt states he didn't feel the pain too much today. Reports some increased pain after sitting in the car for too long.    Patient is accompained by: Family member    Pertinent History Prior back surgery in 2018    How long can you sit comfortably? Sometimes 5-10 min; sometimes a couple of hours    How long can you stand comfortably? minutes or hours    How long can you walk comfortably? can get around grocery store bending over the cart despite foot numbness    Diagnostic tests --    Patient Stated Goals Decrease pain and  numbness    Currently in Pain? 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, lumbar region    Referring Provider (PT) Jessy Oto, MD    Next MD Visit 12/07/21                           [facility] Adult PT Treatment/Exercise - 11/02/21 0001       Lumbar Exercises: Stretches   Passive Hamstring Stretch Right;Left;2 reps;30 seconds    Hip Flexor Stretch Right;30 seconds;Left    Hip Flexor Stretch Limitations in prone    Other Lumbar Stretch Exercise seated with forward trunk flexion, rolling red pball  forward and side to side x 5 each, 5-10 sec      Lumbar Exercises: Aerobic   Nustep L5 x UEs/LEs x 6 min      Lumbar Exercises: Standing   Lifting 10 reps   KB from 8" step   Lifting Weights (lbs) 10    Row Strengthening;Both;10 reps    Theraband Level (Row) Level 4 (Blue)   4 sec hold   Other Standing Lumbar Exercises anti-rotation with forward press, green band 2 x 10 reps each side.      Lumbar Exercises: Supine   Dead Bug 10 reps    Dead Bug Limitations 3 sec hold    Other Supine Lumbar Exercises bilat knee bend with pball under feet x10   cues for core engagement     Lumbar Exercises: Sidelying   Hip Abduction Right;Left;20 reps   red tband     Lumbar Exercises: Prone   Straight Leg Raise 20 reps                          PT Long Term Goals - 10/26/21 1820       PT LONG TERM GOAL #1   Title Pt will be  independent with his HEP    Time 6    Period Weeks    Status Not Met    Target Date 12/07/21      PT LONG TERM GOAL #2   Title Pt will report at least 50% reduction in his LBP    Baseline varies day to day; 40-60% improvement 12/29    Time 6    Period Weeks    Status Partially Met    Target Date 12/07/21      PT LONG TERM GOAL #3   Title Pt will be able to squat 10 lbs with good form utilizing LEs and not bending with his back    Time 6    Period Weeks    Status New    Target Date 12/07/21      PT LONG TERM GOAL #4   Title Pt will demo improved core strength by maintaining posterior pelvic tilt during Bilateral Bent Leg Lift    Time 6    Period Weeks    Status Achieved    Target Date 10/09/21      PT LONG TERM GOAL #5   Title Pt will have improved FOTO score to at least 60    Baseline 57 at intake, 54 on 10/03/21    Time 6    Period Weeks    Status On-going    Target Date 12/07/21                   Plan - 11/02/21 1420     Clinical Impression Statement Continued to work on core strengthening and functional lifting with 10#. Pt tolerated well with improving core engagement.    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[employer];Occupation;Shop;Yard Work;Laundry;Community Activity;Cleaning;Meal Prep    Stability/Clinical Decision Making Evolving/Moderate complexity    Rehab Potential Good    PT Frequency 2x / week    PT Duration 6 weeks    PT Treatment/Interventions ADLs/Self Care Home Management;Aquatic Therapy;Cryotherapy;Electrical Stimulation;Iontophoresis 43m/ml Dexamethasone;Moist Heat;Traction;DME Instruction;Gait training;Stair training;Functional mobility training;Therapeutic activities;Therapeutic exercise;Balance training;Neuromuscular  re-education;Patient/family education;Manual techniques;Passive range of motion;Dry needling;Taping    PT Next Visit Plan revise HEP to  assist with compliance (unable to use bands due to old house with limited structurs to safely secure). Continue core stabilization and hip strengthening.    PT Home Exercise Plan Access Code [email or web address]    Consulted and Agree with Plan of Care Patient             Patient will benefit from skilled therapeutic intervention in order to improve the following deficits and impairments:  Abnormal gait, Decreased range of motion, Difficulty walking, Increased fascial restricitons, Decreased endurance, Obesity, Pain, Decreased activity tolerance, Hypomobility, Improper body mechanics, Decreased mobility, Decreased strength, Impaired sensation, Postural dysfunction  Visit Diagnosis: Muscle weakness (generalized)  Chronic bilateral low back pain with right-sided sciatica  Abnormal posture  Difficulty in walking, not elsewhere classified  Other abnormalities of gait and mobility     Problem List Patient Active Problem List   Diagnosis Date Noted   Spinal stenosis of lumbar region 06/19/2017    Class: Chronic   Status post lumbar laminectomy 06/19/2017    [facility] April Gordy Levan, PT, DPT 11/02/2021, 2:28 PM  [facility] [place], [place], [address] Phone: [phone]   Fax:  [phone]  Name: Allen Henry. MRN: [id number] Date of Birth: Oct 18, 1955

## 2021-11-07 ENCOUNTER — Ambulatory Visit: Payer: Medicare Other | Admitting: Physical Therapy

## 2021-11-07 ENCOUNTER — Encounter: Payer: Self-pay | Admitting: Physical Therapy

## 2021-11-07 ENCOUNTER — Other Ambulatory Visit: Payer: Self-pay

## 2021-11-07 DIAGNOSIS — G8929 Other chronic pain: Secondary | ICD-10-CM

## 2021-11-07 DIAGNOSIS — M6281 Muscle weakness (generalized): Secondary | ICD-10-CM

## 2021-11-07 DIAGNOSIS — R293 Abnormal posture: Secondary | ICD-10-CM

## 2021-11-07 NOTE — Therapy (Signed)
[place], [place], [address] Phone: [phone]   Fax:  [phone]  Physical Therapy Treatment  Patient Details  Name: Allen Henry. MRN: [id number] Date of Birth: 1956/06/22 Referring Provider (PT): Jessy Oto, MD   Encounter Date: 11/07/2021   PT End of Session - 11/07/21 1110     Visit Number 13    Number of Visits 24    Date for PT Re-Evaluation 12/07/21    Authorization Type BCBS    PT Start Time 1105    PT Stop Time 1143    PT Time Calculation (min) 38 min    Activity Tolerance Patient tolerated treatment well    Behavior During Therapy WFL for tasks assessed/performed             Past Medical History:  Diagnosis Date   Arthritis    Headache    Pneumonia    Vertigo     Past Surgical History:  Procedure Laterality Date   COLONOSCOPY     LUMBAR LAMINECTOMY N/A 06/19/2017   Procedure: RIGHT LUMBAR THREE-FOUE AND LUMBAR FIVE-SACRAL ONE  PARTIAL HEMILAMINECTOMY, BILATERAL PARTIAL HEMILAMINECTOMY LUMBAR FOUR-FIVE;  Surgeon: Jessy Oto, MD;  Location: [facility];  Service: Orthopedics;  Laterality: N/A;  RIGHT Lumbar three-four AND Lumbar five-Sacrum one  PARTIAL HEMILAMINECTOMY, BILATERAL PARTIAL HEMILAMINECTOMY Lumbar four-five    TONSILLECTOMY      There were no vitals filed for this visit.   Subjective Assessment - 11/07/21 1111     Subjective Pt reports he was sore (5/10) for 2 days after last session. "You could tell I did some work".    Patient Stated Goals Decrease pain and numbness    Currently in Pain? 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, lumbar region    Referring Provider (PT) Jessy Oto, MD    Next MD Visit 12/07/21               [facility] Adult PT Treatment/Exercise - 11/07/21 0001       Lumbar Exercises: Stretches   Passive Hamstring Stretch Right;Left;30 seconds;3 reps   hooklying with strap   Lower Trunk Rotation 10 seconds;5 reps    Standing Extension 2 reps;5 seconds   2 sets   Other Lumbar Stretch Exercise seated with forward trunk flexion, rolling red pball forward and side to side x 5 each, 5-10 sec      Lumbar Exercises: Aerobic   Tread Mill trial; stopped unable to tolerate.    Nustep L5 x UEs/LEs x 6 min    Other Aerobic Exercise single laps around gym to assess response to exercises.      Lumbar Exercises: Standing   Lifting 10 reps   KB from 8" step   Lifting Weights (lbs) 10    Row Strengthening;Both;10 reps    Theraband Level (Row) Level 4 (Blue)   4 sec hold   Other Standing Lumbar Exercises yellow weighted ball diagonals x10  each way    Other Standing Lumbar Exercises anti-rotation with forward press, green band 2 x 10 reps each side.  Farmers carry with 10# in one hand x 80 ft each.      Lumbar Exercises: Supine   Dead Bug 10 reps    Dead Bug Limitations 3 sec hold      Lumbar Exercises: Sidelying   Hip Abduction Right;Left;10 reps   2 sets, 2 set hold.              PT Long Term Goals - 11/07/21 1121       PT LONG TERM GOAL #1   Title Pt will be independent with his HEP    Time 6    Period Weeks    Status On-going    Target Date 12/07/21      PT LONG TERM GOAL #2   Title Pt will report at least 50% reduction in his LBP    Baseline varies day to day; 40-60% improvement    Time 6    Period Weeks    Status Achieved    Target Date 12/07/21      PT LONG TERM GOAL #3   Title Pt will be able to squat 10 lbs with good form utilizing LEs and not bending  with his back    Baseline requires minor cues on form.    Time 6    Period Weeks    Status Partially Met    Target Date 12/07/21      PT LONG TERM GOAL #4   Title Pt will demo improved core strength by maintaining posterior pelvic tilt during Bilateral Bent Leg Lift    Time 6    Period Weeks    Status Achieved    Target Date 10/09/21      PT LONG TERM GOAL #5   Title Pt will have improved FOTO score to at least 60    Baseline 57 at intake, 54 on 10/03/21    Time 6    Period Weeks    Status On-going    Target Date 12/07/21                   Plan - 11/07/21 1132     Clinical Impression Statement Pt reported slight increase in lower back discomfort after anti rotation exercise.  Discomfort improved with standing trunk extension.  Required cues for more upright posture.  He can lift 10# from low surface, but requires cues to engage core and bend knees.  Pt has partially met his goals.    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[employer];Occupation;Shop;Yard Work;Laundry;Community Activity;Cleaning;Meal Prep    Stability/Clinical Decision Making Evolving/Moderate complexity    Rehab Potential Good    PT Frequency 2x / week    PT Duration 6 weeks    PT Treatment/Interventions ADLs/Self Care Home Management;Aquatic Therapy;Cryotherapy;Electrical Stimulation;Iontophoresis 4mg /ml Dexamethasone;Moist Heat;Traction;DME Instruction;Gait training;Stair training;Functional mobility training;Therapeutic activities;Therapeutic exercise;Balance training;Neuromuscular re-education;Patient/family education;Manual techniques;Passive range of motion;Dry needling;Taping    PT Next Visit Plan revise HEP to assist with compliance (unable to use bands due to old house with limited structurs  to safely secure). Continue core stabilization and hip strengthening.    PT Home Exercise Plan Access Code [email or web address]    Consulted and Agree with Plan of Care Patient             Patient will benefit from skilled  therapeutic intervention in order to improve the following deficits and impairments:  Abnormal gait, Decreased range of motion, Difficulty walking, Increased fascial restricitons, Decreased endurance, Obesity, Pain, Decreased activity tolerance, Hypomobility, Improper body mechanics, Decreased mobility, Decreased strength, Impaired sensation, Postural dysfunction  Visit Diagnosis: Muscle weakness (generalized)  Chronic bilateral low back pain with right-sided sciatica  Abnormal posture     Problem List Patient Active Problem List   Diagnosis Date Noted   Spinal stenosis of lumbar region 06/19/2017    Class: Chronic   Status post lumbar laminectomy 06/19/2017   Kerin Perna, PTA 11/07/21 11:42 AM   [place], [place], [address] Phone: [phone]   Fax:  [phone]  Name: Allen Henry. MRN: [id number] Date of Birth: 1956-06-25

## 2021-11-09 ENCOUNTER — Ambulatory Visit: Payer: Medicare Other | Admitting: Physical Therapy

## 2021-11-14 ENCOUNTER — Encounter: Payer: Self-pay | Admitting: Physical Therapy

## 2021-11-14 ENCOUNTER — Other Ambulatory Visit: Payer: Self-pay

## 2021-11-14 ENCOUNTER — Ambulatory Visit: Payer: Medicare Other | Attending: Specialist | Admitting: Physical Therapy

## 2021-11-14 DIAGNOSIS — R293 Abnormal posture: Secondary | ICD-10-CM | POA: Insufficient documentation

## 2021-11-14 DIAGNOSIS — M5441 Lumbago with sciatica, right side: Secondary | ICD-10-CM | POA: Insufficient documentation

## 2021-11-14 DIAGNOSIS — M6281 Muscle weakness (generalized): Secondary | ICD-10-CM | POA: Diagnosis not present

## 2021-11-14 DIAGNOSIS — R2689 Other abnormalities of gait and mobility: Secondary | ICD-10-CM | POA: Diagnosis present

## 2021-11-14 DIAGNOSIS — R262 Difficulty in walking, not elsewhere classified: Secondary | ICD-10-CM | POA: Insufficient documentation

## 2021-11-14 DIAGNOSIS — G8929 Other chronic pain: Secondary | ICD-10-CM | POA: Insufficient documentation

## 2021-11-14 NOTE — Therapy (Signed)
[place], [place], [address] Phone: [phone]   Fax:  [phone]  Physical Therapy Treatment  Patient Details  Name: Allen Henry. MRN: [id number] Date of Birth: March 30, 1956 Referring Provider (PT): Jessy Oto, MD   Encounter Date: 11/14/2021   PT End of Session - 11/14/21 1156     Visit Number 14    Number of Visits 24    Date for PT Re-Evaluation 12/07/21    Authorization Type BCBS    PT Start Time 1152    PT Stop Time 1230    PT Time Calculation (min) 38 min             Past Medical History:  Diagnosis Date   Arthritis    Headache    Pneumonia    Vertigo     Past Surgical History:  Procedure Laterality Date   COLONOSCOPY     LUMBAR LAMINECTOMY N/A 06/19/2017   Procedure: RIGHT LUMBAR THREE-FOUE AND LUMBAR FIVE-SACRAL ONE  PARTIAL HEMILAMINECTOMY, BILATERAL PARTIAL HEMILAMINECTOMY LUMBAR FOUR-FIVE;  Surgeon: Jessy Oto, MD;  Location: [facility];  Service: Orthopedics;  Laterality: N/A;  RIGHT Lumbar three-four AND Lumbar five-Sacrum one  PARTIAL HEMILAMINECTOMY, BILATERAL PARTIAL HEMILAMINECTOMY Lumbar four-five    TONSILLECTOMY      There were no vitals filed for this visit.   Subjective Assessment - 11/14/21 1157     Subjective Pt reports he had some stomach issues last week and that is why he missed last session.  He reports no new changes with back since last visit.    Currently in Pain? Yes    Pain Score 4    not too bad   Pain Location Back    Pain Orientation Lower    Pain Descriptors / Indicators Sore    Pain Radiating Towards middle and into the Rt.    Pain Relieving Factors ?                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 Adult PT Treatment/Exercise - 11/14/21 0001       Lumbar Exercises: Stretches   Passive Hamstring Stretch Right;Left;30 seconds;3 reps   hooklying with strap   Lower Trunk Rotation 10 seconds;5 reps    Standing Extension 2 reps;5 seconds   2 sets   Other Lumbar Stretch Exercise seated with forward trunk flexion, rolling red pball forward and side to side x 3 each, 5-10 sec      Lumbar Exercises: Aerobic   Recumbent Bike unable to get LE into pedals.    Nustep L5 x UEs/LEs x 5 min    Other Aerobic Exercise single laps around gym to assess response to exercises.      Lumbar Exercises: Standing   Lifting From floor;5 reps    Lifting Weights (lbs) 10    Row Strengthening;Both;10 reps   3 sec in retraction, 2 sets   Theraband Level (Row) Level 4 (Blue)    Shoulder Extension Both;10 reps;Theraband   3  sec in retraction, 2 sets   Theraband Level (Shoulder Extension) Level 3 (Green)    Other Standing Lumbar Exercises yellow weighted ball diagonals x10 each way, 2 sets      Lumbar Exercises: Supine   Dead Bug 10 reps    Dead Bug Limitations 3 sec hold    Bridge 10 reps;3 seconds      Lumbar Exercises: Sidelying   Hip Abduction Right;Left;10 reps   2 sets, 2 set hold.   Other Sidelying Lumbar Exercises open book x 5 reps each side.                          PT Long Term Goals - 11/07/21 1121       PT LONG TERM GOAL #1   Title Pt will be independent with his HEP    Time 6    Period Weeks    Status On-going    Target Date 12/07/21      PT LONG TERM GOAL #2   Title Pt will report at least 50% reduction in his LBP    Baseline varies day to day; 40-60% improvement    Time 6    Period Weeks    Status Achieved    Target Date 12/07/21      PT LONG TERM GOAL #3    Title Pt will be able to squat 10 lbs with good form utilizing LEs and not bending with his back    Baseline requires minor cues on form.    Time 6    Period Weeks    Status Partially Met    Target Date 12/07/21      PT LONG TERM GOAL #4   Title Pt will demo improved core strength by maintaining posterior pelvic tilt during Bilateral Bent Leg Lift    Time 6    Period Weeks    Status Achieved    Target Date 10/09/21      PT LONG TERM GOAL #5   Title Pt will have improved FOTO score to at least 60    Baseline 57 at intake, 54 on 10/03/21    Time 6    Period Weeks    Status On-going    Target Date 12/07/21                   Plan - 11/14/21 1217     Clinical Impression Statement Pt tolerating exericses without increase in pain during session.  He participates well throughout session, requiring only minor cues for form.  Pt making gradual progress towards remaining goals.    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[employer];Occupation;Shop;Yard Work;Laundry;Community Activity;Cleaning;Meal Prep    Stability/Clinical Decision Making Evolving/Moderate complexity    Rehab Potential Good    PT Frequency 2x / week    PT Duration 6 weeks    PT Treatment/Interventions ADLs/Self Care Home Management;Aquatic Therapy;Cryotherapy;Electrical Stimulation;Iontophoresis 98m/ml Dexamethasone;Moist Heat;Traction;DME Instruction;Gait training;Stair training;Functional mobility training;Therapeutic activities;Therapeutic exercise;Balance training;Neuromuscular re-education;Patient/family education;Manual techniques;Passive range of motion;Dry needling;Taping    PT Next Visit Plan Continue core stabilization and hip strengthening. (unable to use bands due to old house with limited  structurs to safely secure).  FOTO.    PT Home Exercise Plan Access Code [email or web address]    Consulted and Agree with Plan of Care Patient             Patient  will benefit from skilled therapeutic intervention in order to improve the following deficits and impairments:  Abnormal gait, Decreased range of motion, Difficulty walking, Increased fascial restricitons, Decreased endurance, Obesity, Pain, Decreased activity tolerance, Hypomobility, Improper body mechanics, Decreased mobility, Decreased strength, Impaired sensation, Postural dysfunction  Visit Diagnosis: Muscle weakness (generalized)  Chronic bilateral low back pain with right-sided sciatica  Abnormal posture     Problem List Patient Active Problem List   Diagnosis Date Noted   Spinal stenosis of lumbar region 06/19/2017    Class: Chronic   Status post lumbar laminectomy 06/19/2017   Kerin Perna, PTA 11/14/21 12:40 PM  [place], [place], [address] Phone: [phone]   Fax:  [phone]  Name: Allen Henry. MRN: [id number] Date of Birth: Jul 25, 1956

## 2021-11-16 ENCOUNTER — Ambulatory Visit: Payer: Medicare Other | Admitting: Physical Therapy

## 2021-11-16 ENCOUNTER — Other Ambulatory Visit: Payer: Self-pay

## 2021-11-16 DIAGNOSIS — G8929 Other chronic pain: Secondary | ICD-10-CM

## 2021-11-16 DIAGNOSIS — R262 Difficulty in walking, not elsewhere classified: Secondary | ICD-10-CM

## 2021-11-16 DIAGNOSIS — M6281 Muscle weakness (generalized): Secondary | ICD-10-CM | POA: Diagnosis not present

## 2021-11-16 DIAGNOSIS — R2689 Other abnormalities of gait and mobility: Secondary | ICD-10-CM

## 2021-11-16 DIAGNOSIS — R293 Abnormal posture: Secondary | ICD-10-CM

## 2021-11-16 NOTE — Therapy (Signed)
[place], [place], [address] Phone: [phone]   Fax:  [phone]  Physical Therapy Treatment  Patient Details  Name: Allen Henry. MRN: [id number] Date of Birth: 08-Jan-1956 Referring Provider (PT): Jessy Oto, MD   Encounter Date: 11/16/2021   PT End of Session - 11/16/21 1359     Visit Number 15    Number of Visits 24    Date for PT Re-Evaluation 12/07/21    Authorization Type BCBS    PT Start Time 1400    PT Stop Time 1445    PT Time Calculation (min) 45 min    Activity Tolerance Patient tolerated treatment well    Behavior During Therapy WFL for tasks assessed/performed             Past Medical History:  Diagnosis Date   Arthritis    Headache    Pneumonia    Vertigo     Past Surgical History:  Procedure Laterality Date   COLONOSCOPY     LUMBAR LAMINECTOMY N/A 06/19/2017   Procedure: RIGHT LUMBAR THREE-FOUE AND LUMBAR FIVE-SACRAL ONE  PARTIAL HEMILAMINECTOMY, BILATERAL PARTIAL HEMILAMINECTOMY LUMBAR FOUR-FIVE;  Surgeon: Jessy Oto, MD;  Location: [facility];  Service: Orthopedics;  Laterality: N/A;  RIGHT Lumbar three-four AND Lumbar five-Sacrum one  PARTIAL HEMILAMINECTOMY, BILATERAL PARTIAL HEMILAMINECTOMY Lumbar four-five    TONSILLECTOMY      There were no vitals filed for this visit.   Subjective Assessment - 11/16/21 1404     Subjective Pt states he is to see Dr. Louanne Skye again beginning of March. Pt felt pretty good after last session. Pt states he had to lift a lot and perform chores that irritated his back.    Patient is accompained by: Family member    Pertinent History Prior back surgery in 2018    How long can you sit comfortably? Sometimes 5-10 min; sometimes a couple of hours    How long can you stand comfortably? minutes or hours    How long can you walk comfortably? can get around grocery store bending over the cart despite foot numbness     Diagnostic tests MRI 10/28: L3-4: Previous right hemilaminectomy. Mild right foraminal narrowing due to encroachment by osteophytes but without definite compression of the exiting L3 nerve.  L4-5: Previous posterior decompression. Bilateral facet arthropathy  with 1 mm of anterolisthesis. Bulging of the disc. Bilateral  foraminal narrowing right more than left potential to affect the  exiting L4 nerves.  L5-S1: Previous posterior decompression. Sufficient patency of the  canal and foramina.    Patient Stated Goals Decrease pain and numbness    Currently in Pain? Yes    Pain Score 3     Pain Location Back                               [facility] Adult PT Treatment/Exercise - 11/16/21 0001       Lumbar Exercises: Stretches   Passive Hamstring Stretch Right;Left;30 seconds;3 reps    Passive Hamstring Stretch Limitations seated    Hip Flexor Stretch Right;30 seconds;Left    Quad Stretch 30 seconds;Right;Left    Other Lumbar Stretch Exercise seated with forward trunk flexion, rolling green pball forward and side to side x 3 each, 5-10 sec      Lumbar Exercises: Aerobic   Nustep L5 x UEs/LEs x 6 min      Lumbar  Exercises: Supine   Bent Knee Raise 10 reps    Dead Bug 10 reps    Dead Bug Limitations 3 sec hold      Lumbar Exercises: Prone   Single Arm Raise Right;Left;10 reps    Straight Leg Raise 20 reps    Other Prone Lumbar Exercises horizontal abduction 2x10                          PT Long Term Goals - 11/07/21 1121       PT LONG TERM GOAL #1   Title Pt will be independent with his HEP    Time 6    Period Weeks    Status On-going    Target Date 12/07/21      PT LONG TERM GOAL #2   Title Pt will report at least 50% reduction in his LBP    Baseline varies day to day; 40-60% improvement    Time 6    Period Weeks    Status Achieved    Target Date 12/07/21      PT LONG TERM GOAL #3   Title Pt will be able to squat 10 lbs with good form  utilizing LEs and not bending with his back    Baseline requires minor cues on form.    Time 6    Period Weeks    Status Partially Met    Target Date 12/07/21      PT LONG TERM GOAL #4   Title Pt will demo improved core strength by maintaining posterior pelvic tilt during Bilateral Bent Leg Lift    Time 6    Period Weeks    Status Achieved    Target Date 10/09/21      PT LONG TERM GOAL #5   Title Pt will have improved FOTO score to at least 60    Baseline 57 at intake, 54 on 10/03/21    Time 6    Period Weeks    Status On-going    Target Date 12/07/21                   Plan - 11/16/21 1442     Clinical Impression Statement Trialed lifting box this session -- poor pt tolerance and form. Able to utilize good form with sit<>stand using 10#; however, difficulty transitioning to squat. Discussed deadlifting; however, pt also with poor form. Pt would benefit from continued PT to continue to work on proper Midwife.    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[employer];Occupation;Shop;Yard Work;Laundry;Community Activity;Cleaning;Meal Prep    Stability/Clinical Decision Making Evolving/Moderate complexity    Rehab Potential Good    PT Frequency 2x / week    PT Duration 6 weeks    PT Treatment/Interventions ADLs/Self Care Home Management;Aquatic Therapy;Cryotherapy;Electrical Stimulation;Iontophoresis 22m/ml Dexamethasone;Moist Heat;Traction;DME Instruction;Gait training;Stair training;Functional mobility training;Therapeutic activities;Therapeutic exercise;Balance training;Neuromuscular re-education;Patient/family education;Manual techniques;Passive range of motion;Dry needling;Taping    PT Next Visit Plan Continue core stabilization and hip strengthening. (unable  to use bands due to old house with limited structurs to safely secure).  FOTO.    PT Home Exercise Plan Access Code [email or web address]    Consulted and Agree with Plan of Care Patient             Patient will benefit from skilled therapeutic intervention in order to improve the following deficits and impairments:  Abnormal gait,  Decreased range of motion, Difficulty walking, Increased fascial restricitons, Decreased endurance, Obesity, Pain, Decreased activity tolerance, Hypomobility, Improper body mechanics, Decreased mobility, Decreased strength, Impaired sensation, Postural dysfunction  Visit Diagnosis: Muscle weakness (generalized)  Chronic bilateral low back pain with right-sided sciatica  Abnormal posture  Difficulty in walking, not elsewhere classified  Other abnormalities of gait and mobility     Problem List Patient Active Problem List   Diagnosis Date Noted   Spinal stenosis of lumbar region 06/19/2017    Class: Chronic   Status post lumbar laminectomy 06/19/2017    [facility] April Gordy Levan, PT, DPT 11/16/2021, 2:48 PM  [facility] [place], [place], [address] Phone: [phone]   Fax:  [phone]  Name: Amos Micheals. MRN: [id number] Date of Birth: [birth year]

## 2021-11-23 ENCOUNTER — Ambulatory Visit: Payer: Medicare Other | Admitting: Physical Therapy

## 2021-11-23 ENCOUNTER — Other Ambulatory Visit: Payer: Self-pay

## 2021-11-23 DIAGNOSIS — M5441 Lumbago with sciatica, right side: Secondary | ICD-10-CM

## 2021-11-23 DIAGNOSIS — M6281 Muscle weakness (generalized): Secondary | ICD-10-CM | POA: Diagnosis not present

## 2021-11-23 DIAGNOSIS — R293 Abnormal posture: Secondary | ICD-10-CM

## 2021-11-23 DIAGNOSIS — R262 Difficulty in walking, not elsewhere classified: Secondary | ICD-10-CM

## 2021-11-23 DIAGNOSIS — G8929 Other chronic pain: Secondary | ICD-10-CM

## 2021-11-23 DIAGNOSIS — R2689 Other abnormalities of gait and mobility: Secondary | ICD-10-CM

## 2021-11-23 NOTE — Therapy (Signed)
[facility] [place], [place], [address] Phone: [phone]   Fax:  [phone]  Physical Therapy Treatment  Patient Details  Name: Allen Henry. MRN: [id number] Date of Birth: 1956-03-18 Referring Provider (PT): Jessy Oto, MD   Encounter Date: 11/23/2021   PT End of Session - 11/23/21 0931     Visit Number 16    Number of Visits 24    Date for PT Re-Evaluation 12/07/21    Authorization Type BCBS    PT Start Time 0931    PT Stop Time 1015    PT Time Calculation (min) 44 min    Activity Tolerance Patient tolerated treatment well    Behavior During Therapy [facility] for tasks assessed/performed             Past Medical History:  Diagnosis Date   Arthritis    Headache    Pneumonia    Vertigo     Past Surgical History:  Procedure Laterality Date   COLONOSCOPY     LUMBAR LAMINECTOMY N/A 06/19/2017   Procedure: RIGHT LUMBAR THREE-FOUE AND LUMBAR FIVE-SACRAL ONE  PARTIAL HEMILAMINECTOMY, BILATERAL PARTIAL HEMILAMINECTOMY LUMBAR FOUR-FIVE;  Surgeon: Jessy Oto, MD;  Location: [facility];  Service: Orthopedics;  Laterality: N/A;  RIGHT Lumbar three-four AND Lumbar five-Sacrum one  PARTIAL HEMILAMINECTOMY, BILATERAL PARTIAL HEMILAMINECTOMY Lumbar four-five    TONSILLECTOMY      There were no vitals filed for this visit.   Subjective Assessment - 11/23/21 0945     Subjective Pt reports increased soreness after last session especially on Saturday.    Patient is accompained by: Family member    Pertinent History Prior back surgery in 2018    How long can you sit comfortably? Sometimes 5-10 min; sometimes a couple of hours    How long can you stand comfortably? minutes or hours    How long can you walk comfortably? can get around grocery store bending over the cart despite foot numbness    Diagnostic tests MRI 10/28: L3-4: Previous right hemilaminectomy. Mild right foraminal narrowing due to  encroachment by osteophytes but without definite compression of the exiting L3 nerve.  L4-5: Previous posterior decompression. Bilateral facet arthropathy  with 1 mm of anterolisthesis. Bulging of the disc. Bilateral  foraminal narrowing right more than left potential to affect the  exiting L4 nerves.  L5-S1: Previous posterior decompression. Sufficient patency of the  canal and foramina.    Patient Stated Goals Decrease pain and numbness    Currently in Pain? Yes    Pain Score 3     Pain Location Back    Pain Orientation Lower    Pain Descriptors / Indicators Sore                               [facility] Adult PT Treatment/Exercise - 11/23/21 0001       Lumbar Exercises: Stretches   Passive Hamstring Stretch Right;Left;30 seconds;2 reps    Passive Hamstring Stretch Limitations supine with strap    Double Knee to Chest Stretch 1 rep;30 seconds    Hip Flexor Stretch Right;30 seconds;Left    Figure 4 Stretch 30 seconds;Supine;With overpressure      Lumbar Exercises: Aerobic   Nustep L5 x UEs/LEs x 6 min      Lumbar Exercises: Seated   Sit to Stand 10 reps    Sit to Stand Limitations with hip hinge and  10# KB    Other Seated Lumbar Exercises on dynadisk: pelvic tilt forward/back x10; L<>R x10      Lumbar Exercises: Supine   Bent Knee Raise 5 reps    Bent Knee Raise Limitations 10 sec hold bilat knee bend    Dead Bug 20 reps    Dead Bug Limitations 3 sec hold                          PT Long Term Goals - 11/07/21 1121       PT LONG TERM GOAL #1   Title Pt will be independent with his HEP    Time 6    Period Weeks    Status On-going    Target Date 12/07/21      PT LONG TERM GOAL #2   Title Pt will report at least 50% reduction in his LBP    Baseline varies day to day; 40-60% improvement    Time 6    Period Weeks    Status Achieved    Target Date 12/07/21      PT LONG TERM GOAL #3   Title Pt will be able to squat 10 lbs with good form  utilizing LEs and not bending with his back    Baseline requires minor cues on form.    Time 6    Period Weeks    Status Partially Met    Target Date 12/07/21      PT LONG TERM GOAL #4   Title Pt will demo improved core strength by maintaining posterior pelvic tilt during Bilateral Bent Leg Lift    Time 6    Period Weeks    Status Achieved    Target Date 10/09/21      PT LONG TERM GOAL #5   Title Pt will have improved FOTO score to at least 60    Baseline 57 at intake, 54 on 10/03/21    Time 6    Period Weeks    Status On-going    Target Date 12/07/21                   Plan - 11/23/21 0948     Clinical Impression Statement Treatment focused on continuing to strengthen hips and core. Work on lifting from more elevated position for more gradual progression to lifting with improved tolerance and body mechanics. Increased time spent on proper lift mechanics. Increased pain with pelvic tilt but with improved sacral mobility after performing.    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[employer];Occupation;Shop;Yard Work;Laundry;Community Activity;Cleaning;Meal Prep    Stability/Clinical Decision Making Evolving/Moderate complexity    Rehab Potential Good    PT Frequency 2x / week    PT Duration 6 weeks    PT Treatment/Interventions ADLs/Self Care Home Management;Aquatic Therapy;Cryotherapy;Electrical Stimulation;Iontophoresis 27m/ml Dexamethasone;Moist Heat;Traction;DME Instruction;Gait training;Stair training;Functional mobility training;Therapeutic activities;Therapeutic exercise;Balance training;Neuromuscular re-education;Patient/family education;Manual techniques;Passive range of motion;Dry needling;Taping    PT Next Visit Plan Continue core stabilization and hip  strengthening. (unable to use bands due to old house with limited structurs to safely secure).  FOTO.    PT Home Exercise Plan Access Code [email or web address]    Consulted and Agree with Plan of Care Patient             Patient will benefit from skilled therapeutic intervention in order to improve the following deficits and impairments:  Abnormal gait, Decreased range of motion, Difficulty walking, Increased fascial restricitons, Decreased endurance, Obesity, Pain, Decreased activity tolerance, Hypomobility, Improper body mechanics, Decreased mobility, Decreased strength, Impaired sensation, Postural dysfunction  Visit Diagnosis: Muscle weakness (generalized)  Chronic bilateral low back pain with right-sided sciatica  Abnormal posture  Difficulty in walking, not elsewhere classified  Other abnormalities of gait and mobility     Problem List Patient Active Problem List   Diagnosis Date Noted   Spinal stenosis of lumbar region 06/19/2017    Class: Chronic   Status post lumbar laminectomy 06/19/2017    [facility] April Gordy Levan, PT, DPT 11/23/2021, 10:13 AM  [facility] [place], [place], [address] Phone: [phone]   Fax:  [phone]  Name: Allen Henry. MRN: [id number] Date of Birth: May 10, 1956

## 2021-12-01 ENCOUNTER — Ambulatory Visit: Payer: Medicare Other | Admitting: Physical Therapy

## 2021-12-01 ENCOUNTER — Other Ambulatory Visit: Payer: Self-pay

## 2021-12-01 DIAGNOSIS — M6281 Muscle weakness (generalized): Secondary | ICD-10-CM | POA: Diagnosis not present

## 2021-12-01 DIAGNOSIS — M5441 Lumbago with sciatica, right side: Secondary | ICD-10-CM

## 2021-12-01 DIAGNOSIS — R2689 Other abnormalities of gait and mobility: Secondary | ICD-10-CM

## 2021-12-01 DIAGNOSIS — R262 Difficulty in walking, not elsewhere classified: Secondary | ICD-10-CM

## 2021-12-01 DIAGNOSIS — G8929 Other chronic pain: Secondary | ICD-10-CM

## 2021-12-01 DIAGNOSIS — R293 Abnormal posture: Secondary | ICD-10-CM

## 2021-12-04 NOTE — Therapy (Signed)
[place], [place], [address] Phone: [phone]   Fax:  [phone]  Physical Therapy Treatment and Discharge  Patient Details  Name: Allen Henry. MRN: [id number] Date of Birth: [birth year] Referring Provider (PT): Jessy Oto, MD  PHYSICAL THERAPY DISCHARGE SUMMARY  Visits from Start of Care: 17  Current functional level related to goals / functional outcomes: See below   Remaining deficits: See below   Education / Equipment: See below   Patient agrees to discharge. Patient goals were partially met. Patient is being discharged due to meeting the stated rehab goals.   Encounter Date: 12/01/2021   PT End of Session - 12/04/21 0832     Visit Number 17    Number of Visits 24    Date for PT Re-Evaluation 12/07/21    Authorization Type BCBS    PT Start Time 1405    PT Stop Time 1445    PT Time Calculation (min) 40 min    Activity Tolerance Patient tolerated treatment well    Behavior During Therapy WFL for tasks assessed/performed             Past Medical History:  Diagnosis Date   Arthritis    Headache    Pneumonia    Vertigo     Past Surgical History:  Procedure Laterality Date   COLONOSCOPY     LUMBAR LAMINECTOMY N/A 06/19/2017   Procedure: RIGHT LUMBAR THREE-FOUE AND LUMBAR FIVE-SACRAL ONE  PARTIAL HEMILAMINECTOMY, BILATERAL PARTIAL HEMILAMINECTOMY LUMBAR FOUR-FIVE;  Surgeon: Jessy Oto, MD;  Location: [place];  Service: Orthopedics;  Laterality: N/A;  RIGHT Lumbar three-four AND Lumbar five-Sacrum one  PARTIAL HEMILAMINECTOMY, BILATERAL PARTIAL HEMILAMINECTOMY Lumbar four-five    TONSILLECTOMY      There were no vitals filed for this visit.   Subjective Assessment - 12/04/21 0832     Subjective Pt reports he was active helping his neighbor the day before yesterday and was really sore the day after. States soreness has improved now.    Patient is  accompained by: Family member    Pertinent History Prior back surgery in 2018    How long can you sit comfortably? Sometimes 5-10 min; sometimes a couple of hours    How long can you stand comfortably? minutes or hours    How long can you walk comfortably? can get around grocery store bending over the cart despite foot numbness    Diagnostic tests MRI 10/28: L3-4: Previous right hemilaminectomy. Mild right foraminal narrowing due to encroachment by osteophytes but without definite compression of the exiting L3 nerve.  L4-5: Previous posterior decompression. Bilateral facet arthropathy  with 1 mm of anterolisthesis. Bulging of the disc. Bilateral  foraminal narrowing right more than left potential to affect the  exiting L4 nerves.  L5-S1: Previous posterior decompression. Sufficient patency of the  canal and foramina.    Patient Stated Goals Decrease pain and numbness    Currently in Pain? Yes    Pain Score 4     Pain Location Back    Pain Orientation Lower                               [facility] Adult PT Treatment/Exercise - 12/04/21 0001       Lumbar Exercises: Stretches   Lower Trunk Rotation 30 seconds;2 reps    Hip Flexor Stretch Right;30 seconds;Left;3 reps    Hip Flexor  Stretch Limitations trialed in standing and on steps      Lumbar Exercises: Standing   Other Standing Lumbar Exercises lateral band walk 2x10 green tband    Other Standing Lumbar Exercises Backwards monster walk green tband 2x10      Lumbar Exercises: Seated   Sit to Stand 10 reps   with 10# KB   Other Seated Lumbar Exercises Squat with 10# KB x10      Lumbar Exercises: Supine   Bent Knee Raise 10 reps    Bent Knee Raise Limitations x10 alternating; 5x10 sec hold with bilat knee raise                     PT Education - 12/04/21 0834     Education Details Discussed lifting mechanics at length and being more consistent with utilizing correct body mechanics. Reinforced performing his  stretches and exercises at home.    Person(s) Educated Patient    Methods Explanation;Demonstration;Tactile cues;Verbal cues    Comprehension Verbalized understanding;Returned demonstration;Verbal cues required;Tactile cues required                 PT Long Term Goals - 12/04/21 0833       PT LONG TERM GOAL #1   Title Pt will be independent with his HEP    Time 6    Period Weeks    Status Not Met    Target Date 12/07/21      PT LONG TERM GOAL #2   Title Pt will report at least 50% reduction in his LBP    Baseline varies day to day; 40-60% improvement    Time 6    Period Weeks    Status Achieved    Target Date 12/07/21      PT LONG TERM GOAL #3   Title Pt will be able to squat 10 lbs with good form utilizing LEs and not bending with his back    Baseline requires cues for consistency    Time 6    Period Weeks    Status Achieved    Target Date 12/07/21      PT LONG TERM GOAL #4   Title Pt will demo improved core strength by maintaining posterior pelvic tilt during Bilateral Bent Leg Lift    Time 6    Period Weeks    Status Achieved    Target Date 10/09/21      PT LONG TERM GOAL #5   Title Pt will have improved FOTO score to at least 60    Baseline 57 at intake, 54 on 10/03/21, 60 on 11/16/21    Time 6    Period Weeks    Status Achieved    Target Date 12/07/21                   Plan - 12/04/21 2130     Clinical Impression Statement Continued to work and encourage correct lifting mechanics. Re-checked pt's goals and he has met or partially met most of them. Reviewed final HEP with pt and stretches. He remains inconsistent with performing them. Discussed continuing to strengthen and use his hips and knee to decrease compensating with his low back. Pt's FOTO score has ranged from 54 to 60. Pt has met max PT potential and is ready for PT d/c.    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;Bend    [employer];Occupation;Shop;Yard Work;Laundry;Community Activity;Cleaning;Meal  Prep    Stability/Clinical Decision Making Evolving/Moderate complexity    Rehab Potential Good    PT Frequency 2x / week    PT Duration 6 weeks    PT Treatment/Interventions ADLs/Self Care Home Management;Aquatic Therapy;Cryotherapy;Electrical Stimulation;Iontophoresis 16m/ml Dexamethasone;Moist Heat;Traction;DME Instruction;Gait training;Stair training;Functional mobility training;Therapeutic activities;Therapeutic exercise;Balance training;Neuromuscular re-education;Patient/family education;Manual techniques;Passive range of motion;Dry needling;Taping    PT Next Visit Plan Continue core stabilization and hip strengthening. (unable to use bands due to old house with limited structurs to safely secure).    PT Home Exercise Plan Access Code [email or web address]    Consulted and Agree with Plan of Care Patient             Patient will benefit from skilled therapeutic intervention in order to improve the following deficits and impairments:  Abnormal gait, Decreased range of motion, Difficulty walking, Increased fascial restricitons, Decreased endurance, Obesity, Pain, Decreased activity tolerance, Hypomobility, Improper body mechanics, Decreased mobility, Decreased strength, Impaired sensation, Postural dysfunction  Visit Diagnosis: Muscle weakness (generalized)  Chronic bilateral low back pain with right-sided sciatica  Abnormal posture  Difficulty in walking, not elsewhere classified  Other abnormalities of gait and mobility     Problem List Patient Active Problem List   Diagnosis Date Noted   Spinal stenosis of lumbar region 06/19/2017    Class: Chronic   Status post lumbar laminectomy 06/19/2017    [facility]April MGordy Levan PT, DPT 12/04/2021, 8:35 AM  [facility] [address]Phone: [phone]  Fax:  [phone] Name: Allen Henry MRN: [id number] of Birth: 127-Jan-1957

## 2021-12-07 ENCOUNTER — Other Ambulatory Visit: Payer: Self-pay

## 2021-12-07 ENCOUNTER — Ambulatory Visit: Payer: Medicare Other | Admitting: Specialist

## 2021-12-07 ENCOUNTER — Encounter: Payer: Self-pay | Admitting: Specialist

## 2021-12-07 VITALS — BP 128/78 | HR 80 | Ht 70.0 in | Wt 299.0 lb

## 2021-12-07 DIAGNOSIS — M48061 Spinal stenosis, lumbar region without neurogenic claudication: Secondary | ICD-10-CM

## 2021-12-07 DIAGNOSIS — M5136 Other intervertebral disc degeneration, lumbar region: Secondary | ICD-10-CM | POA: Diagnosis not present

## 2021-12-07 DIAGNOSIS — M47816 Spondylosis without myelopathy or radiculopathy, lumbar region: Secondary | ICD-10-CM | POA: Diagnosis not present

## 2021-12-07 DIAGNOSIS — M4316 Spondylolisthesis, lumbar region: Secondary | ICD-10-CM | POA: Diagnosis not present

## 2021-12-07 DIAGNOSIS — M160 Bilateral primary osteoarthritis of hip: Secondary | ICD-10-CM

## 2021-12-07 NOTE — Progress Notes (Signed)
? ?Office Visit Note ?  ?Patient: Allen Henry.           ?Date of Birth: [birth year]           ?MRN: [id number] ?Visit Date: 12/07/2021 ?             ?Requested by: Marvis Repress, MD ?No address on file ?PCP: Marvis Repress, MD ? ? ?Assessment & Plan: ?Visit Diagnoses:  ?1. Spondylolisthesis, lumbar region   ?2. Degenerative disc disease, lumbar   ?3. Spondylosis without myelopathy or radiculopathy, lumbar region   ?4. Spinal stenosis of lumbar region without neurogenic claudication   ?5. Bilateral primary osteoarthritis of hip   ? ? ?Plan: Avoid frequent bending and stooping  ?No lifting greater than 10 lbs. ?May use ice or moist heat for pain. ?Weight loss is of benefit. ?Best medication for lumbar disc disease is arthritis medications like motrin, celebrex and naprosyn. ?Exercise is important to improve your indurance and does allow people to function better inspite of back pain. ? Therapy and if it is not successful then fusion of the segments that are painful would be a consideration. ? ?Follow-Up Instructions: No follow-ups on file.  ? ?Orders:  ?No orders of the defined types were placed in this encounter. ? ?No orders of the defined types were placed in this encounter. ? ? ? ? Procedures: ?No procedures performed ? ? ?Clinical Data: ?No additional findings. ? ? ?Subjective: ?Chief Complaint  ?Patient presents with  ? Lower Back - Pain, Follow-up  ? ? ?66 year old male with history of lumbar pain post laminectomy surgery he has a spondylolisthesis at L4-5. Pain is bad some days more than others and sometimes it is not bad. No bowel or bladder difficulty. ? ?Review of Systems  ?Constitutional: Negative.   ?HENT: Negative.    ?Eyes: Negative.   ?Respiratory: Negative.    ?Cardiovascular: Negative.   ?Gastrointestinal: Negative.   ?Endocrine: Negative.   ?Genitourinary: Negative.   ?Musculoskeletal: Negative.   ?Skin: Negative.   ?Allergic/Immunologic: Negative.   ?Neurological: Negative.    ?Hematological: Negative.   ?Psychiatric/Behavioral: Negative.    ? ? ?Objective: ?Vital Signs: BP 128/78 (BP Location: Left Arm, Patient Position: Sitting, Cuff Size: Large)   Pulse 80   Ht 5\' 10"  (1.778 m)   Wt 299 lb (135.6 kg)   BMI 42.90 kg/m?  ? ?Physical Exam ?Constitutional:   ?   Appearance: He is well-developed.  ?HENT:  ?   Head: Normocephalic and atraumatic.  ?Eyes:  ?   Pupils: Pupils are equal, round, and reactive to light.  ?Pulmonary:  ?   Effort: Pulmonary effort is normal.  ?   Breath sounds: Normal breath sounds.  ?Abdominal:  ?   General: Bowel sounds are normal.  ?   Palpations: Abdomen is soft.  ?Musculoskeletal:  ?   Cervical back: Normal range of motion and neck supple.  ?   Lumbar back: Negative right straight leg raise test and negative left straight leg raise test.  ?Skin: ?   General: Skin is warm and dry.  ?Neurological:  ?   Mental Status: He is alert and oriented to person, place, and time.  ?Psychiatric:     ?   Behavior: Behavior normal.     ?   Thought Content: Thought content normal.     ?   Judgment: Judgment normal.  ? ?Back Exam  ? ?Tenderness  ?The patient is experiencing tenderness in the lumbar. ? ?Range of  Motion  ?Extension:  abnormal  ?Lateral bend right:  abnormal  ?Lateral bend left:  abnormal  ?Rotation right:  abnormal  ?Rotation left:  abnormal  ? ?Muscle Strength  ?Right Quadriceps:  5/5  ?Left Quadriceps:  5/5  ?Right Hamstrings:  5/5  ?Left Hamstrings:  5/5  ? ?Tests  ?Straight leg raise right: negative ?Straight leg raise left: negative ? ? ? ?Specialty Comments:  ?No specialty comments available. ? ?Imaging: ?No results found. ? ? ?PMFS History: ?Patient Active Problem List  ? Diagnosis Date Noted  ? Spinal stenosis of lumbar region 06/19/2017  ?  Priority: High  ?  Class: Chronic  ? Status post lumbar laminectomy 06/19/2017  ? ?Past Medical History:  ?Diagnosis Date  ? Arthritis   ? Headache   ? Pneumonia   ? Vertigo   ?  ?Family History  ?Problem Relation  Age of Onset  ? Heart attack Father   ? Cancer Father   ? Cancer Mother   ?  ?Past Surgical History:  ?Procedure Laterality Date  ? COLONOSCOPY    ? LUMBAR LAMINECTOMY N/A 06/19/2017  ? Procedure: RIGHT LUMBAR THREE-FOUE AND LUMBAR FIVE-SACRAL ONE  PARTIAL HEMILAMINECTOMY, BILATERAL PARTIAL HEMILAMINECTOMY LUMBAR FOUR-FIVE;  Surgeon: Kerrin Champagne, MD;  Location[facility];  Service: Orthopedics;  Laterality: N/A;  RIGHT Lumbar three-four AND Lumbar five-Sacrum one  PARTIAL HEMILAMINECTOMY, BILATERAL PARTIAL HEMILAMINECTOMY Lumbar four-five   ? TONSILLECTOMY    ? ?Social History  ? ?Occupational History  ? Not on file  ?Tobacco Use  ? Smoking status: Former  ?  Types: Cigarettes  ?  Quit date: 06/12/2002  ?  Years since quitting: 19.5  ? Smokeless tobacco: Never  ?Vaping Use  ? Vaping Use: Never used  ?Substance and Sexual Activity  ? Alcohol use: No  ? Drug use: No  ?  Types: PCP  ? Sexual activity: Not on file  ? ? ? ? ? ? ?

## 2021-12-07 NOTE — Patient Instructions (Signed)
Plan: Avoid frequent bending and stooping  ?No lifting greater than 10 lbs. ?May use ice or moist heat for pain. ?Weight loss is of benefit. ?Best medication for lumbar disc disease is arthritis medications like motrin, celebrex and naprosyn. ?Exercise is important to improve your indurance and does allow people to function better inspite of back pain. ?Therapy and if it is not successful then fusion of the segments that are painful would be a consideration. ?

## 2021-12-22 ENCOUNTER — Encounter: Payer: Self-pay | Admitting: Specialist

## 2022-03-22 ENCOUNTER — Other Ambulatory Visit: Payer: Self-pay | Admitting: Specialist

## 2022-03-22 DIAGNOSIS — M4316 Spondylolisthesis, lumbar region: Secondary | ICD-10-CM

## 2022-06-07 ENCOUNTER — Ambulatory Visit: Payer: Self-pay

## 2022-06-07 ENCOUNTER — Ambulatory Visit: Payer: Medicare Other | Admitting: Specialist

## 2022-06-07 ENCOUNTER — Encounter: Payer: Self-pay | Admitting: Specialist

## 2022-06-07 VITALS — BP 143/83 | HR 94 | Ht 70.0 in | Wt 314.0 lb

## 2022-06-07 DIAGNOSIS — M4316 Spondylolisthesis, lumbar region: Secondary | ICD-10-CM | POA: Diagnosis not present

## 2022-06-07 DIAGNOSIS — G8929 Other chronic pain: Secondary | ICD-10-CM

## 2022-06-07 DIAGNOSIS — M545 Low back pain, unspecified: Secondary | ICD-10-CM | POA: Diagnosis not present

## 2022-06-07 MED ORDER — TRAMADOL HCL 50 MG PO TABS: 50.0000 mg | ORAL_TABLET | Freq: Four times a day (QID) | ORAL | 0 refills | Status: AC | PRN

## 2022-06-07 NOTE — Patient Instructions (Signed)
Avoid bending, stooping and avoid lifting weights greater than 10 lbs. Avoid prolong standing and walking. Avoid frequent bending and stooping  No lifting greater than 10 lbs. May use ice or moist heat for pain. Weight loss is of benefit. Handicap license is approved. Myelogram and post myelogram CT scan is ordered to assess the spondylolisthesis at L4-5 and for any  Nerve compression.

## 2022-06-07 NOTE — Progress Notes (Signed)
Office Visit Note   Patient: Allen Henry.           Date of Birth: Oct 30, 1955           MRN: [id number] Visit Date: 06/07/2022              Requested by: Marvis Repress, MD No address on file PCP: Marvis Repress, MD   Assessment & Plan: Visit Diagnoses:  1. Spondylolisthesis of lumbar region   2. Chronic bilateral low back pain without sciatica    Plan:Avoid bending, stooping and avoid lifting weights greater than 10 lbs. Avoid prolong standing and walking. Avoid frequent bending and stooping  No lifting greater than 10 lbs. May use ice or moist heat for pain. Weight loss is of benefit. Handicap license is approved. Myelogram and post myelogram CT scan is ordered to assess the spondylolisthesis at L4-5 and for any  Nerve compression.      Follow-Up Instructions: Return in about 4 weeks (around 07/05/2022).   Orders:  Orders Placed This Encounter  Procedures   XR Lumbar Spine Complete W/Bend   DG Myelogram Lumbar   No orders of the defined types were placed in this encounter.     Procedures: No procedures performed   Clinical Data: No additional findings.   Subjective: Chief Complaint  Patient presents with   Lower Back - Pain, Follow-up    66 year old male with worsening back buttock and posterior thigh pain. Increased with recent driving and bending. No bowel or bladder difficutly. Not able to stand or walk for long periods. Last MRI about 11 months ago and last radiographs with dynamic spondylolisthesis L4-5. MRI did not document the slip as it reduces with reclining and supine position.   Review of Systems  Constitutional: Negative.   HENT: Negative.    Eyes: Negative.   Respiratory: Negative.    Cardiovascular: Negative.   Gastrointestinal: Negative.   Endocrine: Negative.   Genitourinary: Negative.   Musculoskeletal: Negative.   Skin: Negative.   Allergic/Immunologic: Negative.   Neurological: Negative.   Hematological:  Negative.   Psychiatric/Behavioral: Negative.      Objective: Vital Signs: BP (!) 143/83   Pulse 94   Ht 5\' 10"  (1.778 m)   Wt (!) 314 lb (142.4 kg)   BMI 45.05 kg/m   Physical Exam Constitutional:      Appearance: He is well-developed.  HENT:     Head: Normocephalic and atraumatic.  Eyes:     Pupils: Pupils are equal, round, and reactive to light.  Pulmonary:     Effort: Pulmonary effort is normal.     Breath sounds: Normal breath sounds.  Abdominal:     General: Bowel sounds are normal.     Palpations: Abdomen is soft.  Musculoskeletal:     Cervical back: Normal range of motion and neck supple.     Lumbar back: Negative right straight leg raise test and negative left straight leg raise test.  Skin:    General: Skin is warm and dry.  Neurological:     Mental Status: He is alert and oriented to person, place, and time.  Psychiatric:        Behavior: Behavior normal.        Thought Content: Thought content normal.        Judgment: Judgment normal.   Back Exam   Tenderness  The patient is experiencing tenderness in the lumbar.  Range of Motion  Extension:  abnormal  Flexion:  abnormal  Lateral bend right:  abnormal  Lateral bend left:  abnormal  Rotation right:  abnormal  Rotation left:  abnormal   Muscle Strength  Right Quadriceps:  5/5  Left Quadriceps:  5/5  Right Hamstrings:  5/5  Left Hamstrings:  5/5   Tests  Straight leg raise right: negative Straight leg raise left: negative  Reflexes  Patellar:  2/4 Achilles:  2/4  Other  Toe walk: normal Heel walk: normal Sensation: decreased  Comments:  Motor is normal    Specialty Comments:  No specialty comments available.  Imaging: No results found.   PMFS History: Patient Active Problem List   Diagnosis Date Noted   Spinal stenosis of lumbar region 06/19/2017    Priority: High    Class: Chronic   Status post lumbar laminectomy 06/19/2017   Past Medical History:  Diagnosis Date    Arthritis    Headache    Pneumonia    Vertigo     Family History  Problem Relation Age of Onset   Heart attack Father    Cancer Father    Cancer Mother     Past Surgical History:  Procedure Laterality Date   COLONOSCOPY     LUMBAR LAMINECTOMY N/A 06/19/2017   Procedure: RIGHT LUMBAR THREE-FOUE AND LUMBAR FIVE-SACRAL ONE  PARTIAL HEMILAMINECTOMY, BILATERAL PARTIAL HEMILAMINECTOMY LUMBAR FOUR-FIVE;  Surgeon: Kerrin Champagne, MD;  Location: [facility];  Service: Orthopedics;  Laterality: N/A;  RIGHT Lumbar three-four AND Lumbar five-Sacrum one  PARTIAL HEMILAMINECTOMY, BILATERAL PARTIAL HEMILAMINECTOMY Lumbar four-five    TONSILLECTOMY     Social History   Occupational History   Not on file  Tobacco Use   Smoking status: Former    Types: Cigarettes    Quit date: 06/12/2002    Years since quitting: 20.0   Smokeless tobacco: Never  Vaping Use   Vaping Use: Never used  Substance and Sexual Activity   Alcohol use: No   Drug use: No    Types: PCP   Sexual activity: Not on file

## 2022-06-20 ENCOUNTER — Telehealth: Payer: Self-pay | Admitting: Specialist

## 2022-06-20 NOTE — Telephone Encounter (Signed)
Pt called and states that he is suppose to be returning on 07/05/2022 to go over MRI results but pt was never called for an mri. I have looked and [facility] last note and its states "Myelogram and post myelogram CT scan is ordered to assess the spondylolisthesis at L4-5 and for any  Nerve compression." So can we please get started on the referral?

## 2022-06-21 ENCOUNTER — Other Ambulatory Visit: Payer: Self-pay | Admitting: Radiology

## 2022-06-21 DIAGNOSIS — M4316 Spondylolisthesis, lumbar region: Secondary | ICD-10-CM

## 2022-06-21 DIAGNOSIS — G8929 Other chronic pain: Secondary | ICD-10-CM

## 2022-06-21 DIAGNOSIS — M5136 Other intervertebral disc degeneration, lumbar region: Secondary | ICD-10-CM

## 2022-06-21 DIAGNOSIS — M51369 Other intervertebral disc degeneration, lumbar region without mention of lumbar back pain or lower extremity pain: Secondary | ICD-10-CM

## 2022-06-21 NOTE — Telephone Encounter (Signed)
Order has been placed.

## 2022-06-25 ENCOUNTER — Other Ambulatory Visit: Payer: Self-pay | Admitting: Specialist

## 2022-06-25 DIAGNOSIS — M4316 Spondylolisthesis, lumbar region: Secondary | ICD-10-CM

## 2022-06-26 ENCOUNTER — Other Ambulatory Visit: Payer: Self-pay | Admitting: Specialist

## 2022-06-26 DIAGNOSIS — M4316 Spondylolisthesis, lumbar region: Secondary | ICD-10-CM

## 2022-07-03 ENCOUNTER — Ambulatory Visit
Admission: RE | Admit: 2022-07-03 | Discharge: 2022-07-03 | Disposition: A | Discharge status: Home / Self Care | Payer: Medicare Other | Source: Ambulatory Visit | Attending: Specialist | Admitting: Specialist

## 2022-07-03 DIAGNOSIS — M5136 Other intervertebral disc degeneration, lumbar region: Secondary | ICD-10-CM

## 2022-07-03 DIAGNOSIS — M545 Low back pain, unspecified: Secondary | ICD-10-CM

## 2022-07-03 DIAGNOSIS — M4316 Spondylolisthesis, lumbar region: Secondary | ICD-10-CM

## 2022-07-03 MED ORDER — ONDANSETRON HCL 4 MG/2ML IJ SOLN: 4.0000 mg | Freq: Once | INTRAMUSCULAR | Status: DC | PRN

## 2022-07-03 MED ORDER — MEPERIDINE HCL 50 MG/ML IJ SOLN: 50.0000 mg | Freq: Once | INTRAMUSCULAR | Status: DC | PRN

## 2022-07-03 MED ORDER — IOPAMIDOL (ISOVUE-M 200) INJECTION 41%
20.0000 mL | Freq: Once | INTRAMUSCULAR | Status: AC
  Administered 2022-07-03: 20 mL via INTRATHECAL

## 2022-07-03 MED ORDER — DIAZEPAM 5 MG PO TABS
5.0000 mg | ORAL_TABLET | Freq: Once | ORAL | Status: AC
  Administered 2022-07-03: 5 mg via ORAL

## 2022-07-03 NOTE — Discharge Instructions (Signed)

## 2022-07-05 ENCOUNTER — Ambulatory Visit: Payer: Medicare Other | Admitting: Specialist

## 2022-07-10 ENCOUNTER — Encounter: Payer: Self-pay | Admitting: Specialist

## 2022-07-10 ENCOUNTER — Ambulatory Visit: Payer: Medicare Other | Admitting: Specialist

## 2022-07-10 VITALS — BP 146/92 | HR 88 | Ht 70.0 in | Wt 314.0 lb

## 2022-07-10 DIAGNOSIS — M545 Low back pain, unspecified: Secondary | ICD-10-CM

## 2022-07-10 DIAGNOSIS — M48061 Spinal stenosis, lumbar region without neurogenic claudication: Secondary | ICD-10-CM | POA: Diagnosis not present

## 2022-07-10 DIAGNOSIS — M47816 Spondylosis without myelopathy or radiculopathy, lumbar region: Secondary | ICD-10-CM

## 2022-07-10 DIAGNOSIS — M4316 Spondylolisthesis, lumbar region: Secondary | ICD-10-CM | POA: Diagnosis not present

## 2022-07-10 DIAGNOSIS — Z6841 Body Mass Index (BMI) 40.0 and over, adult: Secondary | ICD-10-CM

## 2022-07-10 DIAGNOSIS — G8929 Other chronic pain: Secondary | ICD-10-CM

## 2022-07-10 NOTE — Patient Instructions (Signed)
Plan:Avoid bending, stooping and avoid lifting weights greater than 10 lbs. Avoid prolong standing and walking. Avoid frequent bending and stooping  No lifting greater than 10 lbs. May use ice or moist heat for pain. Weight loss is of benefit. Handicap license is approved.  There is nerve compression in the opening where the nerves exit in the 3 lowest levels of the spine. It is likely that to address the nerve compression to improve your standing and walking tolerance you would need a 3 level lumbar fusion L3-4, L4-5 and L5-S1.  Your BMI is 45 and it would need to be at 40 or less to consider surgery. I recommend you have your primary care MD refer you to a weight reduction clinic to start.

## 2022-07-10 NOTE — Progress Notes (Signed)
Office Visit Note   Patient: Allen Henry.           Date of Birth: 31-Jan-1956           MRN: [id number] Visit Date: 07/10/2022              Requested by: Marvis Repress, MD No address on file PCP: Marvis Repress, MD   Assessment & Plan: Visit Diagnoses:  1. Spondylolisthesis, lumbar region   2. Spondylosis without myelopathy or radiculopathy, lumbar region   3. Spinal stenosis of lumbar region without neurogenic claudication   4. Chronic bilateral low back pain without sciatica   5. Body mass index 40.0-44.9, adult (HCC)     Plan: Plan:Avoid bending, stooping and avoid lifting weights greater than 10 lbs. Avoid prolong standing and walking. Avoid frequent bending and stooping  No lifting greater than 10 lbs. May use ice or moist heat for pain. Weight loss is of benefit. Handicap license is approved.  There is nerve compression in the opening where the nerves exit in the 3 lowest levels of the spine. It is likely that to address the nerve compression to improve your standing and walking tolerance you would need a 3 level lumbar fusion L3-4, L4-5 and L5-S1.  Your BMI is 45 and it would need to be at 40 or less to consider surgery. I recommend you have your primary care MD refer you to a weight reduction clinic to start.    Follow-Up Instructions: Return in about 2 months (around 09/09/2022) for Dr. Christell Constant for follow up in 2 months, spondylolisthesis, foramenal stenosis .   Orders:  No orders of the defined types were placed in this encounter.  No orders of the defined types were placed in this encounter.     Procedures: No procedures performed   Clinical Data: No additional findings.   Subjective: Chief Complaint  Patient presents with   Lower Back - Follow-up    66 year old male with history fall 10-11 years ago in bathroom, pulled toilet out of floor. Pain since then. Had bilateral lateral recess decompression 3 years ago. Pain still with  standing and walking. Has developed a spondylolisthesis at L4-5. Recent myelogram and post myelogram CT with foraminal narrowing right L3-4, L4-5 and L5-S1.  He is having pain that is random and not always due to standing and walking.    Review of Systems  Constitutional: Negative.   HENT: Negative.    Eyes: Negative.   Respiratory: Negative.    Cardiovascular: Negative.   Gastrointestinal: Negative.   Endocrine: Negative.   Genitourinary: Negative.   Musculoskeletal: Negative.   Skin: Negative.   Allergic/Immunologic: Negative.   Neurological: Negative.   Hematological: Negative.   Psychiatric/Behavioral: Negative.       Objective: Vital Signs: BP (!) 146/92 (BP Location: Left Arm, Patient Position: Sitting, Cuff Size: Large)   Pulse 88   Ht 5\' 10"  (1.778 m)   Wt (!) 314 lb (142.4 kg)   BMI 45.05 kg/m   Physical Exam Constitutional:      Appearance: He is well-developed.  HENT:     Head: Normocephalic and atraumatic.  Eyes:     Pupils: Pupils are equal, round, and reactive to light.  Pulmonary:     Effort: Pulmonary effort is normal.     Breath sounds: Normal breath sounds.  Abdominal:     General: Bowel sounds are normal.     Palpations: Abdomen is soft.  Musculoskeletal:  Cervical back: Normal range of motion and neck supple.  Skin:    General: Skin is warm and dry.  Neurological:     Mental Status: He is alert and oriented to person, place, and time.  Psychiatric:        Behavior: Behavior normal.        Thought Content: Thought content normal.        Judgment: Judgment normal.    Back Exam   Tenderness  The patient is experiencing tenderness in the lumbar.  Range of Motion  Extension:  abnormal  Flexion:  abnormal  Lateral bend right:  abnormal  Lateral bend left:  abnormal  Rotation right:  abnormal  Rotation left:  abnormal   Muscle Strength  Right Quadriceps:  5/5  Left Quadriceps:  5/5  Right Hamstrings:  5/5  Left Hamstrings:  5/5    Reflexes  Patellar:  2/4 Achilles:  2/4 Babinski's sign: normal      Specialty Comments:  No specialty comments available.  Imaging: No results found.   PMFS History: Patient Active Problem List   Diagnosis Date Noted   Spinal stenosis of lumbar region 06/19/2017    Priority: High    Class: Chronic   Status post lumbar laminectomy 06/19/2017   Past Medical History:  Diagnosis Date   Arthritis    Headache    Pneumonia    Vertigo     Family History  Problem Relation Age of Onset   Heart attack Father    Cancer Father    Cancer Mother     Past Surgical History:  Procedure Laterality Date   COLONOSCOPY     LUMBAR LAMINECTOMY N/A 06/19/2017   Procedure: RIGHT LUMBAR THREE-FOUE AND LUMBAR FIVE-SACRAL ONE  PARTIAL HEMILAMINECTOMY, BILATERAL PARTIAL HEMILAMINECTOMY LUMBAR FOUR-FIVE;  Surgeon: Jessy Oto, MD;  Location: [facility];  Service: Orthopedics;  Laterality: N/A;  RIGHT Lumbar three-four AND Lumbar five-Sacrum one  PARTIAL HEMILAMINECTOMY, BILATERAL PARTIAL HEMILAMINECTOMY Lumbar four-five    TONSILLECTOMY     Social History   Occupational History   Not on file  Tobacco Use   Smoking status: Former    Types: Cigarettes    Quit date: 06/12/2002    Years since quitting: 20.0   Smokeless tobacco: Never  Vaping Use   Vaping Use: Never used  Substance and Sexual Activity   Alcohol use: No   Drug use: No    Types: PCP   Sexual activity: Not on file

## 2022-08-21 ENCOUNTER — Other Ambulatory Visit: Payer: Self-pay | Admitting: Radiology

## 2022-08-21 DIAGNOSIS — M4316 Spondylolisthesis, lumbar region: Secondary | ICD-10-CM

## 2022-08-21 MED ORDER — METHOCARBAMOL 500 MG PO TABS: ORAL_TABLET | ORAL | 0 refills | Status: DC

## 2022-09-10 ENCOUNTER — Ambulatory Visit: Payer: Medicare Other | Admitting: Orthopedic Surgery

## 2022-09-10 ENCOUNTER — Encounter: Payer: Self-pay | Admitting: Orthopedic Surgery

## 2022-09-10 ENCOUNTER — Ambulatory Visit (INDEPENDENT_AMBULATORY_CARE_PROVIDER_SITE_OTHER): Payer: Medicare Other

## 2022-09-10 VITALS — BP 155/85 | HR 82 | Ht 70.0 in | Wt 315.0 lb

## 2022-09-10 DIAGNOSIS — M4316 Spondylolisthesis, lumbar region: Secondary | ICD-10-CM

## 2022-09-10 NOTE — Progress Notes (Signed)
Orthopedic Spine Surgery Office Note  Assessment: Patient is a 66 y.o. male with low back pain that radiates into bilateral posterior thighs   Plan: -Explained that initially conservative treatment is tried as a significant number of patients may experience relief with these treatment modalities. Discussed that the conservative treatments include:  -activity modification  -physical therapy  -over the counter pain medications  -medrol dosepak  -lumbar steroid injections -Patient has tried physical therapy, activity modification -Recommended core strengthening, tylenol and NSAID use, weight loss -If his pain worsens, will try gabapentin -Patient should return to office in 8 weeks, repeat x-rays of lumbar spine at next visit: none   Patient expressed understanding of the plan and all questions were answered to the patient's satisfaction.   ___________________________________________________________________________   History:  Patient is a 66 y.o. male who presents today for lumbar spine.  Patient has chronic low back pain and has had an acute worsening of that pain within the last 3 weeks.  There is no trauma or injury that preceded this exacerbation of pain.  He feels it in his low back.  It goes into his bilateral legs.  Says it shoots down the posterior aspect of the thighs.  Pain does not go distal to the knees.  Feels the pain is worse with activity and improves with rest.  He has been working on weight loss but feels because of the pain he has not been able to exercise as much which has been hindering his ability to lose weight.   Weakness: Denies Symptoms of imbalance: Denies Paresthesias and numbness: Denies Bowel or bladder incontinence: Denies Saddle anesthesia: Denies  Treatments tried: Physical therapy, activity modification, his wife's medication that he said works well and will put him to sleep  Review of systems: Denies fevers and chills, night sweats, unexplained  weight loss, history of cancer.  Has had pain that wakes him at night  Past medical history: Hyperlipidemia Vertigo Migraines  Allergies: NKDA  Past surgical history:  L3/4 and L5/S1 right-sided hemilaminectomy, bilateral partial hemilaminectomy at L4/5 (2018 - Dr. Otelia Sergeant)  Social history: Denies use of nicotine product (smoking, vaping, patches, smokeless) Alcohol use: denies Denies recreational drug use   Physical Exam:  Height: 5'10", weight: 315, BMI=45  General: no acute distress, appears stated age Neurologic: alert, answering questions appropriately, following commands Respiratory: unlabored breathing on room air, symmetric chest rise Psychiatric: appropriate affect, normal cadence to speech   MSK (spine):  -Strength exam      Left  Right EHL    4/5  4/5 TA    5/5  5/5 GSC    5/5  5/5 Knee extension  5/5  5/5 Hip flexion   5/5  5/5  -Sensory exam    Sensation intact to light touch in L3-S1 nerve distributions of bilateral lower extremities  -Achilles DTR: 1/4 on the left, 1/4 on the right -Patellar tendon DTR: 1/4 on the left, 1/4 on the right  -Straight leg raise: negative -Contralateral straight leg raise: negative -Clonus: no beats bilaterally  -Left hip exam: no pain through range of motion -Right hip exam: no pain through range of motion  Imaging: XR of the lumbar spine from 09/10/2022 and 06/07/2022 was independently reviewed and interpreted, showing spondylolisthesis at L4/5 - changes about 101mm between flexion/extension. Decompression from L3-S1. No fracture or dislocation.    Patient name: Allen Henry. Patient MRN: [id number] Date of visit: 09/10/22

## 2022-11-05 ENCOUNTER — Ambulatory Visit (INDEPENDENT_AMBULATORY_CARE_PROVIDER_SITE_OTHER): Payer: Medicare Other | Admitting: Orthopedic Surgery

## 2022-11-05 DIAGNOSIS — M5416 Radiculopathy, lumbar region: Secondary | ICD-10-CM

## 2022-11-05 NOTE — Progress Notes (Signed)
Orthopedic Spine Surgery Office Note  Assessment: Patient is a 67 y.o. male with low back pain that radiates into bilateral posterior thighs. Similar to when I saw him last   Plan: -Explained that initially conservative treatment is tried as a significant number of patients may experience relief with these treatment modalities. Discussed that the conservative treatments include:  -activity modification  -physical therapy  -over the counter pain medications  -medrol dosepak  -lumbar steroid injections -Patient has tried physical therapy, activity modification, robaxin, tylenol, NSAIDs -Has not done the core exercises discussed at last visit -Recommended he use tylenol for his everyday pain and NSAIDs for flares. I discouraged regular, multiple times a day use of NSAIDs. He can use robaxin as needed -He should try to lose weight and do core exercises to help with his pain -If surgery is ever consider as a treatment option, he would need to lose weight. Weight goal of 250lbs -Patient should return to office in 12 weeks, x-rays at next visit: none   Patient expressed understanding of the plan and all questions were answered to the patient's satisfaction.   ___________________________________________________________________________  History: Patient is a 67 y.o. male who has been previously seen in the office for symptoms consistent with lumbar radiculopathy. Since the last visit, his symptoms have largely remained the same. He has good days and bad days in terms of pain. Pain is still felt in the lower back and radiating into the posterior thighs. It usually stops at the mid thigh but sometimes goes to the knee. No pain radiating past the knee. Tylenol, NSAIDs, and robaxin do make the pain tolerable. If he sits too long, he gets paresthesias and numbness on the posterior aspect of his thighs but that resolves if he moves. No other numbness or paresthesias. Denies bowel or bladder habit changes.  No incontinence. No saddle anesthesia.   Previous treatments: physical therapy, activity modification, robaxin, tylenol, NSAIDs  COPY OF FIRST NOTE Patient is a 67 y.o. male who presents today for lumbar spine.  Patient has chronic low back pain and has had an acute worsening of that pain within the last 3 weeks.  There is no trauma or injury that preceded this exacerbation of pain.  He feels it in his low back.  It goes into his bilateral legs.  Says it shoots down the posterior aspect of the thighs.  Pain does not go distal to the knees.  Feels the pain is worse with activity and improves with rest.  He has been working on weight loss but feels because of the pain he has not been able to exercise as much which has been hindering his ability to lose weight.     Weakness: Denies Symptoms of imbalance: Denies Paresthesias and numbness: Denies Bowel or bladder incontinence: Denies Saddle anesthesia: Denies END OF COPY  Physical Exam:  General: no acute distress, appears stated age Neurologic: alert, answering questions appropriately, following commands Respiratory: unlabored breathing on room air, symmetric chest rise Psychiatric: appropriate affect, normal cadence to speech   MSK (spine):  -Strength exam      Left  Right EHL    4/5  4/5 TA    5/5  5/5 GSC    5/5  5/5 Knee extension  5/5  5/5 Hip flexion   5/5  5/5  -Sensory exam    Sensation intact to light touch in L3-S1 nerve distributions of bilateral lower extremities  -Achilles DTR: 1/4 on the left, 1/4 on the right -Patellar tendon  DTR: 1/4 on the left, 1/4 on the right  -Straight leg raise: negative bilaterally -Clonus: no beats bilaterally  -Left hip exam: no pain through range of motion -Right hip exam: no pain through range of motion  Imaging: XR of the lumbar spine from 09/10/2022 and 06/07/2022 was previously independently reviewed and interpreted, showing spondylolisthesis at L4/5 - changes about 70mm  between flexion/extension. Decompression from L3-S1. No fracture or dislocation.     Patient name: Allen Henry. Patient MRN: [id number] Date of visit: 11/05/22

## 2022-12-28 IMAGING — MR MR LUMBAR SPINE WO/W CM
5 of 8 series · 25 of 48 positions shown · IV contrast (20ML MULTIHANCE)
Comparison: Radiography 09/08/2020

CLINICAL DATA: Spondylolisthesis. Low back pain, greater than 6
weeks. Prior surgery. Worsening low back pain and lower extremity
radicular pain. Pain radiates to the right buttock into both legs.

EXAM:
MRI LUMBAR SPINE WITHOUT AND WITH CONTRAST
TECHNIQUE: Multiplanar and multiecho pulse sequences of the lumbar spine were
obtained without and with intravenous contrast.
CONTRAST:  20mL MULTIHANCE GADOBENATE DIMEGLUMINE 529 MG/ML IV SOLN

[Series 4: T1 · sagittal · 4.0mm · 0.59mm/px · 3 of 19 slices shown (1 of 2)]
[im 1/19]
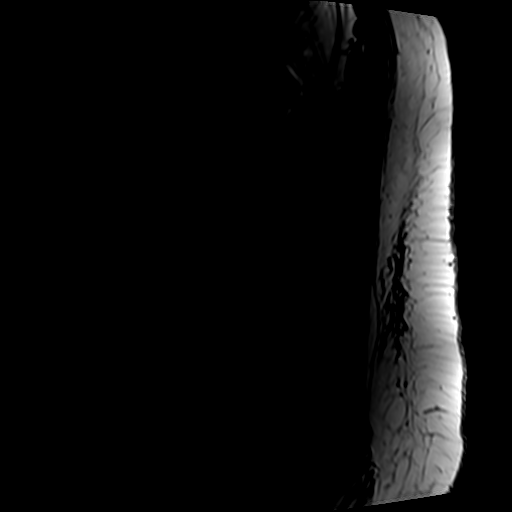
[im 10/19]
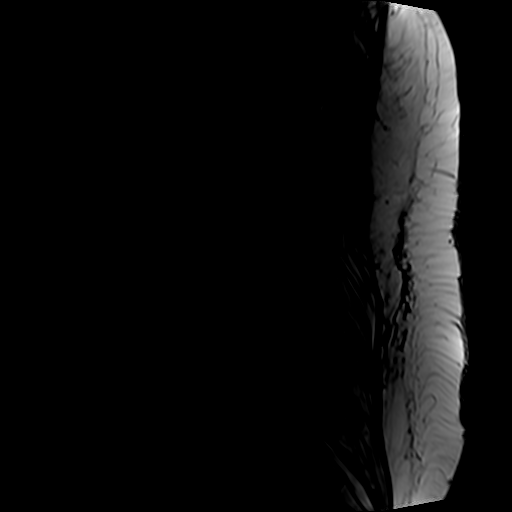
[im 19/19]
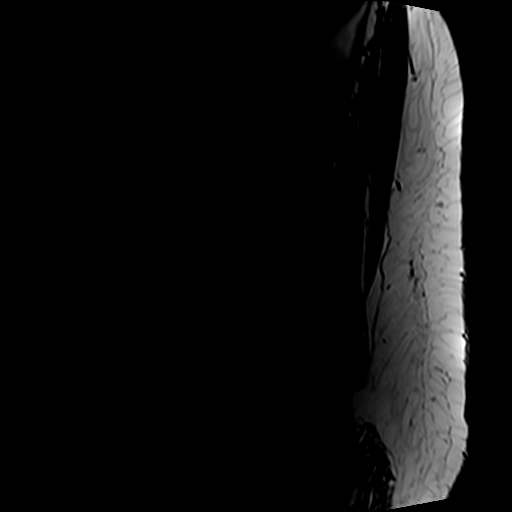

[Series 5: T2 · axial · 4.0mm · 0.82mm/px · z∈[-106,+163]mm · 8 of 53 slices shown (1 of 2)]
[im 1/53]
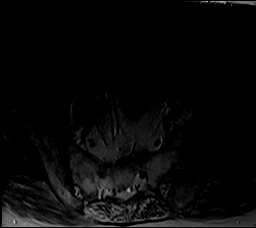
[im 6/53]
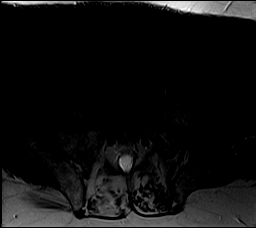
[im 18/53]
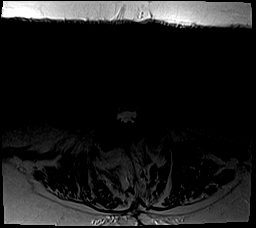
[im 24/53]
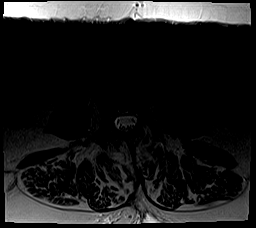
[im 29/53]
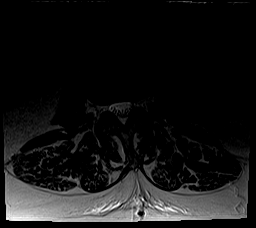
[im 35/53]
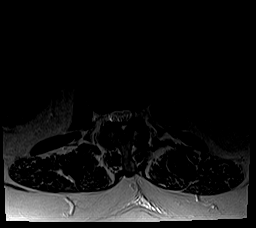
[im 47/53]
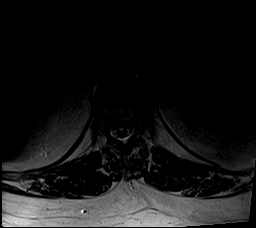
[im 53/53]
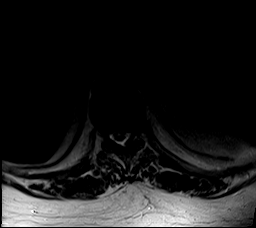

[Series 6: T1 · axial · 4.0mm · 0.41mm/px · z∈[-106,+163]mm · 8 of 53 slices shown (2 of 2)]
[im 1/53]
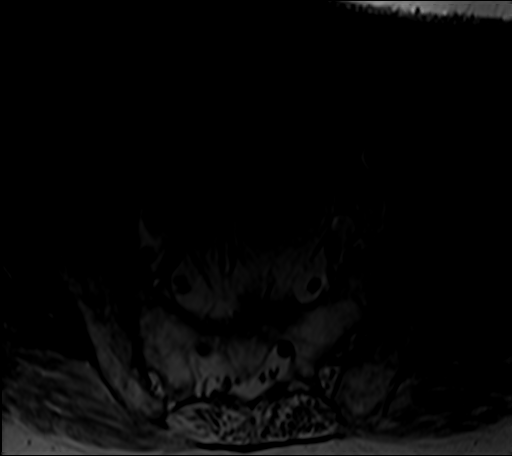
[im 6/53]
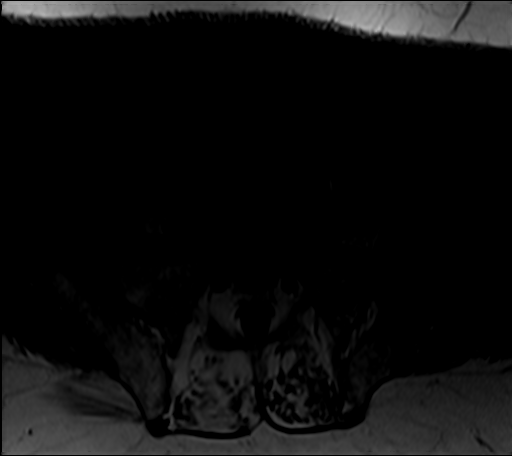
[im 18/53]
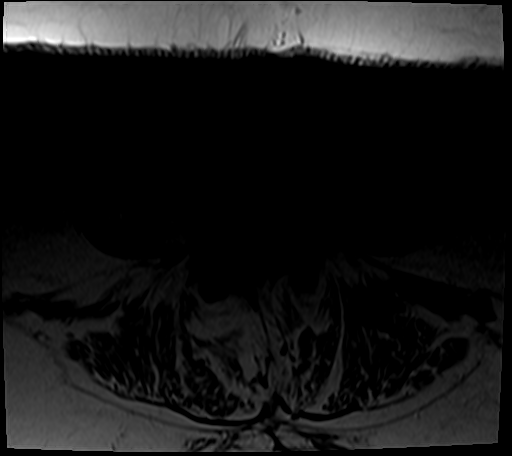
[im 24/53]
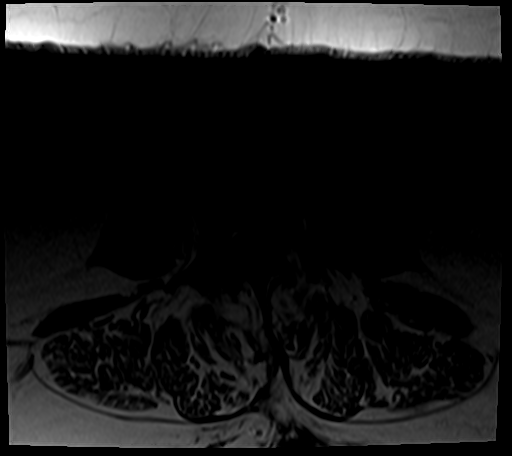
[im 29/53]
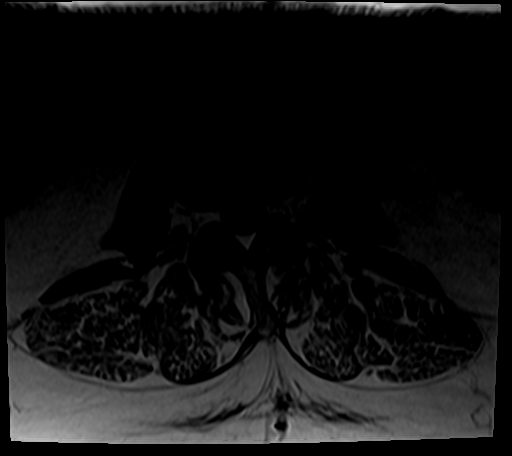
[im 35/53]
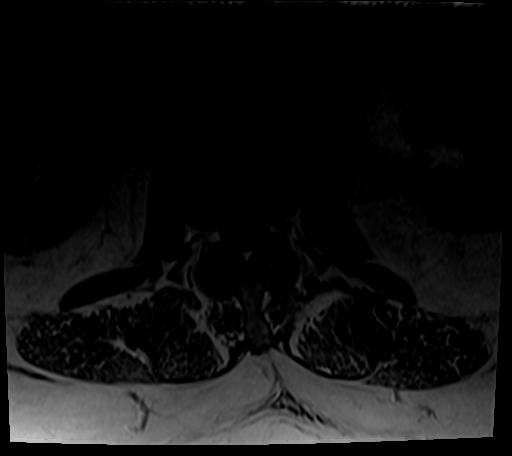
[im 47/53]
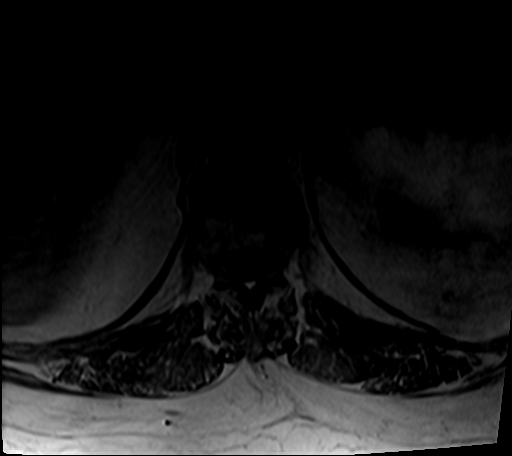
[im 53/53]
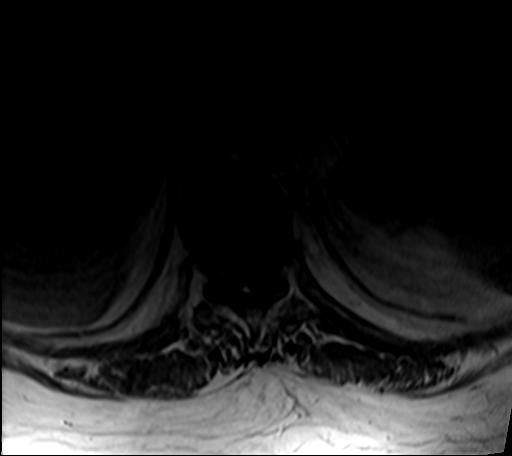

[Series 8: T2 · sagittal · 4.0mm · 0.59mm/px · 4 of 19 slices shown (2 of 2)]
[im 1/19]
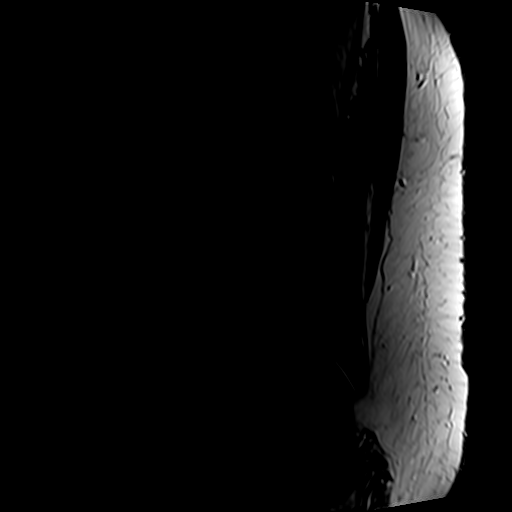
[im 7/19]
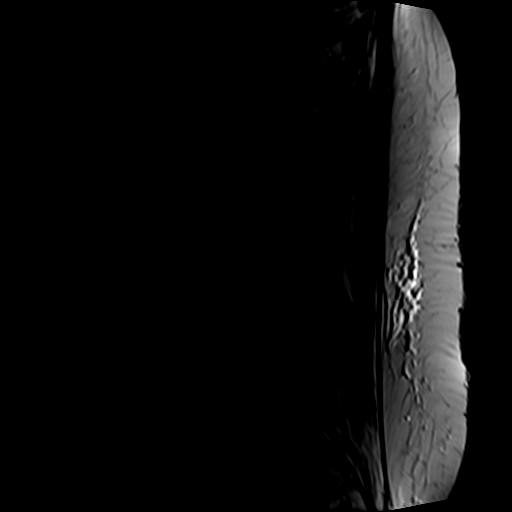
[im 13/19]
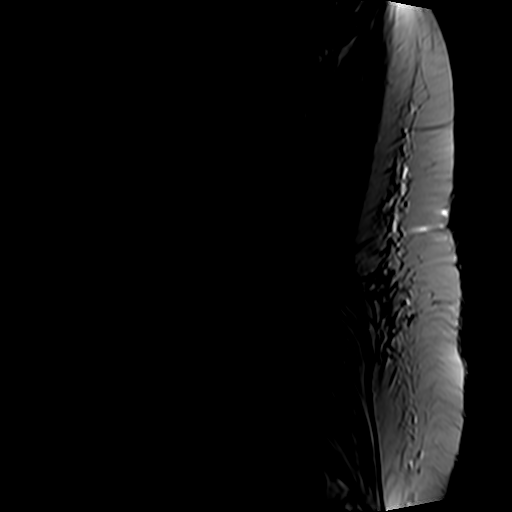
[im 19/19]
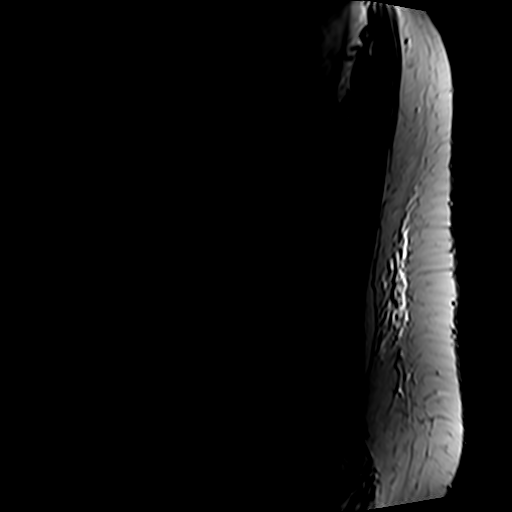

[Series 9: T1 fat-sat · sagittal · 4.0mm · 0.59mm/px · 2 of 19 slices shown]
[im 1/19]
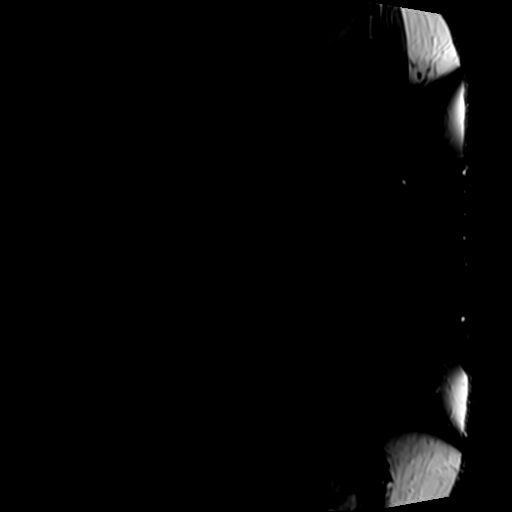
[im 7/19]
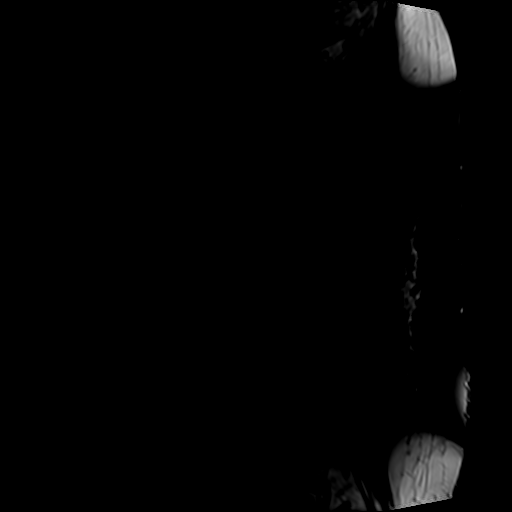

[25 of 48 positions shown; findings below may reference images not displayed]

FINDINGS: Segmentation:  5 lumbar type vertebral bodies.

Alignment:  Normal except for 1 mm of anterolisthesis at L4-5.

Vertebrae:  No focal bone lesion.

Conus medullaris and cauda equina: Conus extends to the L1 level.
Conus and cauda equina appear normal.

Paraspinal and other soft tissues: Negative

Disc levels:

T11-12 and T12-L1: Minimal noncompressive disc bulges.

L1-2: Normal interspace.

L2-3: No disc abnormality.  Minimal facet hypertrophy.  No stenosis.

L3-4: Previous right hemilaminectomy. No disc bulge or herniation.
Mild bony foraminal narrowing on the right.

L4-5: Bilateral facet arthropathy worse on the right. 1 mm of
anterolisthesis. Bulging of the disc. Previous posterior
decompression. No canal stenosis. Bilateral foraminal stenosis,
right worse than left.

L5-S1: Previous posterior decompression. Wide patency of the canal
and foramina.
IMPRESSION: L3-4: Previous right hemilaminectomy. Mild right foraminal narrowing
due to encroachment by osteophytes but without definite compression
of the exiting L3 nerve.

L4-5: Previous posterior decompression. Bilateral facet arthropathy
with 1 mm of anterolisthesis. Bulging of the disc. Bilateral
foraminal narrowing right more than left potential to affect the
exiting L4 nerves.

L5-S1: Previous posterior decompression. Sufficient patency of the
canal and foramina.

## 2023-02-04 ENCOUNTER — Encounter: Payer: Self-pay | Admitting: Orthopedic Surgery

## 2023-02-04 ENCOUNTER — Ambulatory Visit: Payer: Medicare Other | Admitting: Orthopedic Surgery

## 2023-02-04 VITALS — BP 131/84 | HR 106 | Ht 70.0 in | Wt 332.0 lb

## 2023-02-04 DIAGNOSIS — M5416 Radiculopathy, lumbar region: Secondary | ICD-10-CM | POA: Diagnosis not present

## 2023-02-04 NOTE — Progress Notes (Signed)
Orthopedic Spine Surgery Office Note   Assessment: Patient is a 67 y.o. male with low back pain that radiates into bilateral posterior thighs. No interval changes since out last visit     Plan: -Patient has tried physical therapy, activity modification, robaxin, tylenol, NSAIDs -Has not tried any core exercises. Has been using tylenol, NSAIDs, and robaxin -Discussed weight loss again with him because he has gained weight since the last time I saw him 15lbs since the last time I saw him. He states he normally loses weight in the summer so he will work on it -If surgery is ever consider as a treatment option, he would need to lose weight. Weight goal of 250lbs -Patient should return to office in 8 weeks, x-rays at next visit: none     Patient expressed understanding of the plan and all questions were answered to the patient's satisfaction.    ___________________________________________________________________________   History: Patient is a 67 y.o. male who has been previously seen in the office for symptoms consistent with lumbar radiculopathy. He has had a couple of bad days recently because he mowed his lawn in the last week. He had to do it twice because the grass had grown too tall for one pass of the mower. He states that since then his pain has been a little worse. Still feels pain in his low back that radiates into the posterior thighs. Has numbness and paresthesias in the posterior thighs if he sits too long that resolve once he stands. No other numbness or paresthesias.    Previous treatments: physical therapy, activity modification, robaxin, tylenol, NSAIDs    Physical Exam:   BMI of 47.6  General: no acute distress, appears stated age Neurologic: alert, answering questions appropriately, following commands Respiratory: unlabored breathing on room air, symmetric chest rise Psychiatric: appropriate affect, normal cadence to speech     MSK (spine):   -Strength exam                                                    Left                  Right EHL                              4/5                  4/5 TA                                 5/5                  5/5 GSC                             5/5                  5/5 Knee extension            5/5                  5/5 Hip flexion                    5/5  5/5   -Sensory exam                           Sensation intact to light touch in L3-S1 nerve distributions of bilateral lower extremities   -Achilles DTR: 1/4 on the left, 1/4 on the right -Patellar tendon DTR: 1/4 on the left, 1/4 on the right   -Straight leg raise: negative bilaterally -Clonus: no beats bilaterally   -Left hip exam: no pain through range of motion -Right hip exam: no pain through range of motion   Imaging: XR of the lumbar spine from 09/10/2022 and 06/07/2022 was previously independently reviewed and interpreted, 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 Decompression from L3-S1. No fracture or dislocation.       Patient name: Allen Henry. Patient MRN: [id number] Date of visit: 02/04/23

## 2023-04-08 ENCOUNTER — Ambulatory Visit: Payer: Medicare Other | Admitting: Orthopedic Surgery

## 2023-04-30 ENCOUNTER — Telehealth: Payer: Self-pay | Admitting: Orthopedic Surgery

## 2023-04-30 DIAGNOSIS — M4316 Spondylolisthesis, lumbar region: Secondary | ICD-10-CM

## 2023-04-30 MED ORDER — METHOCARBAMOL 500 MG PO TABS: ORAL_TABLET | ORAL | 2 refills | Status: AC

## 2023-04-30 NOTE — Telephone Encounter (Signed)
Patient called needing Rx refilled Methocarbamol. The number to contact patient is [phone]

## 2023-05-23 ENCOUNTER — Ambulatory Visit: Payer: Medicare Other | Admitting: Orthopedic Surgery
